# Patient Record
Sex: Female | Born: 1995 | Race: Black or African American | Hispanic: No | Marital: Married | Smoking: Never smoker
Health system: Southern US, Community
[De-identification: ages and names within clinical notes are randomized; demographics above are authoritative.]

## PROBLEM LIST (undated history)

## (undated) DIAGNOSIS — D582 Other hemoglobinopathies: Secondary | ICD-10-CM

## (undated) DIAGNOSIS — O139 Gestational [pregnancy-induced] hypertension without significant proteinuria, unspecified trimester: Secondary | ICD-10-CM

## (undated) DIAGNOSIS — A749 Chlamydial infection, unspecified: Secondary | ICD-10-CM

## (undated) DIAGNOSIS — D649 Anemia, unspecified: Secondary | ICD-10-CM

## (undated) HISTORY — PX: FRACTURE SURGERY: SHX138

## (undated) HISTORY — PX: HERNIA REPAIR: SHX51

---
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
## 2015-01-26 LAB — OB RESULTS CONSOLE RPR: RPR: NONREACTIVE

## 2015-01-26 LAB — OB RESULTS CONSOLE RUBELLA ANTIBODY, IGM: Rubella: IMMUNE

## 2015-01-26 LAB — OB RESULTS CONSOLE HIV ANTIBODY (ROUTINE TESTING): HIV: NONREACTIVE

## 2015-01-26 LAB — OB RESULTS CONSOLE HEPATITIS B SURFACE ANTIGEN: Hepatitis B Surface Ag: NEGATIVE

## 2015-05-16 NOTE — L&D Delivery Note (Addendum)
Delivery Note 2303: Nurse call reports patient with significant discomfort and urge to push.  In room to assess.  Patient C/C/0, +1.  Patient with good efforts during trial push and room prepped for delivery.  FHR remained reassuring and patient delivered as below with family and staff support.  At 11:32 PM, on August 11, 2015, a viable female "Ann Farmer" was delivered via Vaginal, Spontaneous Delivery (Presentation: Right Occiput Anterior).   After delivery of head, nuchal cord noted that shoulders and body was delivered through via somersault maneuver. Infant with good tone and spontaneous cry. Tactile stimulation and bulb suction given by provider and infant placed on mother's abdomen where nurse continued tactile stimulation.  Infant APGAR: 8,9. Cord clamped, cut, and blood collected. Placenta delivered spontaneously and noted to be intact with 3VC upon inspection.  Placenta to pathology for PIH.  Vaginal inspection revealed a 1st degree perineal laceration that was repaired with 3-0 vicryl on CT-1.  No additional anesthetic necessary and patient tolerated the procedure well. Fundus firm, at the umbilicus, and bleeding small.  Mother hemodynamically stable and infant skin to skin prior to provider exit.  Mother desires birth control, but is unsure of method and opts to breastfeed.  Infant weight at one hour of life: 7lbs 8.5oz, 20.75 in  Anesthesia: Epidural  Episiotomy: None Lacerations: 1st degree Suture Repair: 3.0 vicryl Est. Blood Loss (mL):  200  Mom to postpartum.  Baby to Couplet care / Skin to Skin.  Cherre RobinsJessica L Ksenia Kunz MSN, CNM 08/12/2015, 12:33 AM

## 2015-06-03 LAB — OB RESULTS CONSOLE ABO/RH: RH Type: POSITIVE

## 2015-07-22 LAB — OB RESULTS CONSOLE GC/CHLAMYDIA
Chlamydia: POSITIVE
GC PROBE AMP, GENITAL: NEGATIVE

## 2015-07-22 LAB — OB RESULTS CONSOLE GBS: STREP GROUP B AG: NEGATIVE

## 2015-08-10 ENCOUNTER — Inpatient Hospital Stay (HOSPITAL_COMMUNITY)
Admission: AD | Admit: 2015-08-10 | Discharge: 2015-08-13 | DRG: 774 | Disposition: A | Discharge status: Home / Self Care | Payer: Medicaid Other | Source: Ambulatory Visit | Attending: Obstetrics and Gynecology | Admitting: Obstetrics and Gynecology

## 2015-08-10 ENCOUNTER — Encounter (HOSPITAL_COMMUNITY): Payer: Self-pay | Admitting: *Deleted

## 2015-08-10 DIAGNOSIS — O98819 Other maternal infectious and parasitic diseases complicating pregnancy, unspecified trimester: Secondary | ICD-10-CM | POA: Diagnosis not present

## 2015-08-10 DIAGNOSIS — G5600 Carpal tunnel syndrome, unspecified upper limb: Secondary | ICD-10-CM | POA: Diagnosis present

## 2015-08-10 DIAGNOSIS — A749 Chlamydial infection, unspecified: Secondary | ICD-10-CM | POA: Diagnosis not present

## 2015-08-10 DIAGNOSIS — A568 Sexually transmitted chlamydial infection of other sites: Secondary | ICD-10-CM | POA: Diagnosis present

## 2015-08-10 DIAGNOSIS — Z3A39 39 weeks gestation of pregnancy: Secondary | ICD-10-CM

## 2015-08-10 DIAGNOSIS — J45909 Unspecified asthma, uncomplicated: Secondary | ICD-10-CM | POA: Diagnosis present

## 2015-08-10 DIAGNOSIS — O9832 Other infections with a predominantly sexual mode of transmission complicating childbirth: Secondary | ICD-10-CM | POA: Diagnosis present

## 2015-08-10 DIAGNOSIS — O134 Gestational [pregnancy-induced] hypertension without significant proteinuria, complicating childbirth: Secondary | ICD-10-CM | POA: Diagnosis present

## 2015-08-10 DIAGNOSIS — O133 Gestational [pregnancy-induced] hypertension without significant proteinuria, third trimester: Secondary | ICD-10-CM | POA: Diagnosis present

## 2015-08-10 DIAGNOSIS — O9952 Diseases of the respiratory system complicating childbirth: Secondary | ICD-10-CM | POA: Diagnosis present

## 2015-08-10 DIAGNOSIS — R030 Elevated blood-pressure reading, without diagnosis of hypertension: Secondary | ICD-10-CM | POA: Diagnosis present

## 2015-08-10 HISTORY — DX: Anemia, unspecified: D64.9

## 2015-08-10 HISTORY — DX: Chlamydial infection, unspecified: A74.9

## 2015-08-10 LAB — COMPREHENSIVE METABOLIC PANEL
ALK PHOS: 170 U/L — AB (ref 38–126)
ALT: 12 U/L — ABNORMAL LOW (ref 14–54)
AST: 21 U/L (ref 15–41)
Albumin: 3.2 g/dL — ABNORMAL LOW (ref 3.5–5.0)
Anion gap: 7 (ref 5–15)
BILIRUBIN TOTAL: 0.5 mg/dL (ref 0.3–1.2)
BUN: 13 mg/dL (ref 6–20)
CALCIUM: 8.6 mg/dL — AB (ref 8.9–10.3)
CO2: 20 mmol/L — ABNORMAL LOW (ref 22–32)
CREATININE: 0.75 mg/dL (ref 0.44–1.00)
Chloride: 111 mmol/L (ref 101–111)
GFR calc Af Amer: >60 mL/min (ref >60)
Glucose, Bld: 89 mg/dL (ref 65–99)
POTASSIUM: 3.7 mmol/L (ref 3.5–5.1)
Sodium: 138 mmol/L (ref 135–145)
TOTAL PROTEIN: 6.2 g/dL — AB (ref 6.5–8.1)

## 2015-08-10 LAB — RAPID HIV SCREEN (HIV 1/2 AB+AG)
HIV 1/2 ANTIBODIES: NONREACTIVE
HIV-1 P24 ANTIGEN - HIV24: NONREACTIVE

## 2015-08-10 LAB — PROTEIN / CREATININE RATIO, URINE
Creatinine, Urine: 112 mg/dL
Protein Creatinine Ratio: 0.23 mg/mg{Cre} — ABNORMAL HIGH (ref 0.00–0.15)
TOTAL PROTEIN, URINE: 26 mg/dL

## 2015-08-10 LAB — CBC
HEMATOCRIT: 27.9 % — AB (ref 36.0–46.0)
Hemoglobin: 10.1 g/dL — ABNORMAL LOW (ref 12.0–15.0)
MCH: 28.5 pg (ref 26.0–34.0)
MCHC: 36.2 g/dL — AB (ref 30.0–36.0)
MCV: 78.6 fL (ref 78.0–100.0)
Platelets: 209 10*3/uL (ref 150–400)
RBC: 3.55 MIL/uL — ABNORMAL LOW (ref 3.87–5.11)
RDW: 14.7 % (ref 11.5–15.5)
WBC: 11 10*3/uL — ABNORMAL HIGH (ref 4.0–10.5)

## 2015-08-10 LAB — TYPE AND SCREEN
ABO/RH(D): O POS
ANTIBODY SCREEN: NEGATIVE

## 2015-08-10 LAB — URIC ACID: URIC ACID, SERUM: 7.5 mg/dL — AB (ref 2.3–6.6)

## 2015-08-10 LAB — ABO/RH: ABO/RH(D): O POS

## 2015-08-10 LAB — LACTATE DEHYDROGENASE: LDH: 145 U/L (ref 98–192)

## 2015-08-10 MED ORDER — ONDANSETRON HCL 4 MG/2ML IJ SOLN: 4.0000 mg | Freq: Four times a day (QID) | INTRAMUSCULAR | Status: DC | PRN

## 2015-08-10 MED ORDER — OXYTOCIN BOLUS FROM INFUSION
500.0000 mL | INTRAVENOUS | Status: DC
  Administered 2015-08-11: 500 mL via INTRAVENOUS

## 2015-08-10 MED ORDER — LIDOCAINE HCL (PF) 1 % IJ SOLN
30.0000 mL | INTRAMUSCULAR | Status: DC | PRN
  Filled 2015-08-10: qty 30

## 2015-08-10 MED ORDER — HYDRALAZINE HCL 20 MG/ML IJ SOLN
10.0000 mg | Freq: Once | INTRAMUSCULAR | Status: DC | PRN
Start: 2015-08-10 — End: 2015-08-12
  Filled 2015-08-10: qty 1

## 2015-08-10 MED ORDER — CITRIC ACID-SODIUM CITRATE 334-500 MG/5ML PO SOLN: 30.0000 mL | ORAL | Status: DC | PRN

## 2015-08-10 MED ORDER — LACTATED RINGERS IV SOLN
INTRAVENOUS | Status: DC
  Administered 2015-08-10 – 2015-08-11 (×2): via INTRAVENOUS
  Administered 2015-08-11: 125 mL/h via INTRAVENOUS

## 2015-08-10 MED ORDER — TERBUTALINE SULFATE 1 MG/ML IJ SOLN
0.2500 mg | Freq: Once | INTRAMUSCULAR | Status: DC | PRN
  Filled 2015-08-10: qty 1

## 2015-08-10 MED ORDER — ACETAMINOPHEN 325 MG PO TABS: 650.0000 mg | ORAL_TABLET | ORAL | Status: DC | PRN

## 2015-08-10 MED ORDER — LABETALOL HCL 5 MG/ML IV SOLN
20.0000 mg | INTRAVENOUS | Status: AC | PRN
  Administered 2015-08-10 – 2015-08-11 (×3): 20 mg via INTRAVENOUS
  Filled 2015-08-10 (×3): qty 4

## 2015-08-10 MED ORDER — LACTATED RINGERS IV SOLN: 2.5000 [IU]/h | INTRAVENOUS | Status: DC

## 2015-08-10 MED ORDER — LACTATED RINGERS IV SOLN: 500.0000 mL | INTRAVENOUS | Status: DC | PRN

## 2015-08-10 MED ORDER — FENTANYL CITRATE (PF) 100 MCG/2ML IJ SOLN
50.0000 ug | INTRAMUSCULAR | Status: DC | PRN
  Administered 2015-08-11: 100 ug via INTRAVENOUS
  Administered 2015-08-11: 50 ug via INTRAVENOUS
  Administered 2015-08-11: 100 ug via INTRAVENOUS
  Filled 2015-08-10 (×4): qty 2

## 2015-08-10 MED ORDER — MISOPROSTOL 25 MCG QUARTER TABLET
25.0000 ug | ORAL_TABLET | ORAL | Status: DC | PRN
  Administered 2015-08-10: 25 ug via VAGINAL
  Filled 2015-08-10: qty 0.25
  Filled 2015-08-10: qty 1

## 2015-08-10 NOTE — H&P (Signed)
Ann Farmer is a 20 y.o. female, G1P0 at 39.1 weeks, presenting for evaluation after elevated BP in the office.  Patient sent to MAU for BP check and no significant change.  PIH labs negative in office and patient denies current symptoms.  Dr. ND consulted and patient admitted for IOL secondary to [facility] without significant proteinuria.  Patient desires WB and informed this no longer an option.  Patient pregnancy history significant for CT infection x 2 and current TOC pending.  Patient is GBS negative and has personal history of asthma.    Patient Active Problem List   Diagnosis Date Noted  . Gestational hypertension w/o significant proteinuria in 3rd trimester 08/10/2015  . Asthma 08/10/2015  . Chlamydia infection, current pregnancy 08/10/2015    History of present pregnancy: Patient entered care at 11 weeks at [facility] and Transferred to [facility] at 27.3wks with WB desire.   EDC of 08/16/2015 was established by Definite LMP of 11/09/2014.   Anatomy scan:  20.4 weeks, with normal findings and an anterior placenta.   Additional [place] evaluations:  -[place] 04/01/15--20 4/7 weeks, Cervix 5 cm, anterior fundal placenta, female, poor view of heart and LE -F/U Anatomy 27.3wks: Singleton pregnancy. Vertex presentation. Anterior placenta. AFI is normal--25th%. Cervix closed. Right ovary not visualized. Left ovary and adnexas appear WNL.  Significant prenatal events: 1st Trimester: Positive CT with Negative TOC 2nd Trimester: Tx into practice. 3rd Trimester:   Reports Carpal tunnel syndrome-mgmt discussed.  Reports swelling of feet/ankles. Positive CT-TOC remained positive. C/O BH ctx.  Last evaluation:  08/10/2015 in office by S. Devane-Johnson, CNM.  FHR 140, VE 1/60/-2, BP 142/84.  Wt 195lbs  OB History    Gravida Para Term Preterm AB TAB SAB Ectopic Multiple Living   1              Past Medical History  Diagnosis Date  . Anemia    History reviewed. No pertinent past surgical  history. Family History: family history is not on file. Social History:  reports that she has never smoked. She has never used smokeless tobacco. She reports that she does not drink alcohol or use illicit drugs.  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  Prenatal Transfer Tool  Maternal Diabetes: No Genetic Screening: Normal-Quad and CF Maternal Ultrasounds/Referrals: Normal Fetal Ultrasounds or other Referrals:  None Maternal Substance Abuse:  No Significant Maternal Medications:  None Significant Maternal Lab Results: Lab values include: Group B Strep negative    ROS:  +FM, -Lof, -Vb, -Ctx Patient denies headache, visual disturbances, epigastric pain, numbness/tingling, and SOB Patient denies recent illness.  No Known Allergies   Dilation: 1 Effacement (%): 50 Station: Ballotable Exam by:: Jaydence Arnesen cnm  Blood pressure 163/103, pulse 90, temperature 98.2 F (36.8 C), temperature source Oral, resp. rate 18, height  (1.626 m), weight 88.508 kg (195 lb 2 oz).  Physical Exam  Constitutional: She is oriented to person, place, and time. She appears well-developed and well-nourished. No distress.  HENT:  Head: Normocephalic and atraumatic.  Eyes: Conjunctivae are normal.  Neck: Normal range of motion.  Cardiovascular: Normal rate, regular rhythm and normal heart sounds.   Respiratory: Effort normal and breath sounds normal. No respiratory distress.  GI: Soft. Bowel sounds are normal.  Gravid--fundal height appears AGA, Soft, NT   Genitourinary: Vagina normal.  Musculoskeletal: Normal range of motion. She exhibits edema (+3 Pitting in BLE).  Neurological: She is alert  and oriented to person, place, and time.  Skin: Skin is warm and dry.    Leopolds: EFW: 7lbs Presentation: Vertex  FHR: 165 bpm, Mod Var, -Decels, +Accels UCs:  Occassional  Prenatal labs: ABO, Rh:  O Positive Antibody:  Negative Rubella:   Immune RPR:  NR  HBsAg:   Negative HIV:   NR GBS:  Negative Sickle cell/Hgb electrophoresis:  Negative Pap:  N/A GC:  Negative Chlamydia:  Positive (9/16)/Negative (12/16)/Positive (07/22/2015)/TOC Pending (08/10/2015) Other:  08/06/2015: UA 3.2, LDH: 161   08/10/2015: UA: 7.1, LDH 160, PCR: 0.17    Assessment IUP at 39.1wks Cat I FT GHTN Asthma GBS Negative Positive CT on 3/9  Plan: Admit to [facility]thing Suites per consult with Dr. ND Routine Labor and Delivery Orders per [facility] Protocol Routine Induction/Augmentation Orders PreEclampsia Focused Orderset-Labetalol IV In room to complete assessment and discuss POC: -Discussed r/b of induction including fetal distress, serial induction, pain, and increased risk of c/s delivery -Discussed induction methods including cervical ripening agents, foley bulbs, and pitocin -Patient verbalizes understanding and wishes to proceed with induction process Q/C addressed Plan for cytotec Give IV Labetalol now  Joellyn QuailsJessica L EmlyCNM, MSN 08/10/2015, 9:10 PM     Addendum 2345  In room to start induction process Cytotec inserted PV without difficulty Cat I FT Patient requests meal-informed that clear liquid diet available, but no other meal due to HTN and start of indx process. Encouraged rest Dr. ND updated on patient status  Cherre RobinsJessica L Ingri Diemer

## 2015-08-10 NOTE — MAU Note (Signed)
PT  SAYS  SHE WAS AT OFFICE   TODAY  AND  DID LABS-  WITH  NO RESULTS  YET   SO TOLD  TO COME  HERE    -  BP  HAS  BEEN  ELEVATED.     NO H/A,   NO BLURRED  VISION .   FEET  SWOLLEN.   BP WAS 142/80.     VE   FT.

## 2015-08-11 ENCOUNTER — Inpatient Hospital Stay (HOSPITAL_COMMUNITY): Payer: Medicaid Other | Admitting: Anesthesiology

## 2015-08-11 LAB — CBC
HCT: 27.8 % — ABNORMAL LOW (ref 36.0–46.0)
HEMOGLOBIN: 10.2 g/dL — AB (ref 12.0–15.0)
MCH: 28.9 pg (ref 26.0–34.0)
MCHC: 36.7 g/dL — ABNORMAL HIGH (ref 30.0–36.0)
MCV: 78.8 fL (ref 78.0–100.0)
PLATELETS: 202 10*3/uL (ref 150–400)
RBC: 3.53 MIL/uL — AB (ref 3.87–5.11)
RDW: 14.9 % (ref 11.5–15.5)
WBC: 14.2 10*3/uL — AB (ref 4.0–10.5)

## 2015-08-11 LAB — RPR: RPR: NONREACTIVE

## 2015-08-11 MED ORDER — LIDOCAINE HCL (PF) 1 % IJ SOLN
INTRAMUSCULAR | Status: DC | PRN
Start: 2015-08-11 — End: 2015-08-11
  Administered 2015-08-11: 3 mL
  Administered 2015-08-11: 2 mL via EPIDURAL
  Administered 2015-08-11: 5 mL

## 2015-08-11 MED ORDER — EPHEDRINE 5 MG/ML INJ
10.0000 mg | INTRAVENOUS | Status: DC | PRN
  Filled 2015-08-11: qty 2

## 2015-08-11 MED ORDER — PHENYLEPHRINE 40 MCG/ML (10ML) SYRINGE FOR IV PUSH (FOR BLOOD PRESSURE SUPPORT)
80.0000 ug | PREFILLED_SYRINGE | INTRAVENOUS | Status: DC | PRN
Start: 2015-08-11 — End: 2015-08-12
  Filled 2015-08-11: qty 2

## 2015-08-11 MED ORDER — LACTATED RINGERS IV SOLN
500.0000 mL | Freq: Once | INTRAVENOUS | Status: AC
  Administered 2015-08-11: 500 mL via INTRAVENOUS

## 2015-08-11 MED ORDER — PHENYLEPHRINE 40 MCG/ML (10ML) SYRINGE FOR IV PUSH (FOR BLOOD PRESSURE SUPPORT)
80.0000 ug | PREFILLED_SYRINGE | INTRAVENOUS | Status: DC | PRN
  Filled 2015-08-11: qty 2

## 2015-08-11 MED ORDER — OXYTOCIN 10 UNIT/ML IJ SOLN
1.0000 m[IU]/min | INTRAMUSCULAR | Status: DC
  Administered 2015-08-11: 2 m[IU]/min via INTRAVENOUS
  Filled 2015-08-11: qty 10

## 2015-08-11 MED ORDER — HYDRALAZINE HCL 20 MG/ML IJ SOLN
10.0000 mg | Freq: Once | INTRAMUSCULAR | Status: AC | PRN
  Administered 2015-08-12: 10 mg via INTRAVENOUS

## 2015-08-11 MED ORDER — FENTANYL CITRATE (PF) 100 MCG/2ML IJ SOLN
100.0000 ug | INTRAMUSCULAR | Status: DC | PRN
  Administered 2015-08-11: 100 ug via INTRAVENOUS

## 2015-08-11 MED ORDER — DIPHENHYDRAMINE HCL 50 MG/ML IJ SOLN: 12.5000 mg | INTRAMUSCULAR | Status: DC | PRN

## 2015-08-11 MED ORDER — PHENYLEPHRINE 40 MCG/ML (10ML) SYRINGE FOR IV PUSH (FOR BLOOD PRESSURE SUPPORT)
80.0000 ug | PREFILLED_SYRINGE | INTRAVENOUS | Status: DC | PRN
  Filled 2015-08-11: qty 20
  Filled 2015-08-11: qty 2

## 2015-08-11 MED ORDER — LACTATED RINGERS IV SOLN: 500.0000 mL | Freq: Once | INTRAVENOUS | Status: DC

## 2015-08-11 MED ORDER — LABETALOL HCL 5 MG/ML IV SOLN
20.0000 mg | INTRAVENOUS | Status: DC | PRN
  Administered 2015-08-11: 40 mg via INTRAVENOUS
  Administered 2015-08-11: 20 mg via INTRAVENOUS
  Filled 2015-08-11: qty 4
  Filled 2015-08-11: qty 8
  Filled 2015-08-11 (×4): qty 4

## 2015-08-11 MED ORDER — TERBUTALINE SULFATE 1 MG/ML IJ SOLN
0.2500 mg | Freq: Once | INTRAMUSCULAR | Status: DC | PRN
  Filled 2015-08-11: qty 1

## 2015-08-11 MED ORDER — FENTANYL 2.5 MCG/ML BUPIVACAINE 1/10 % EPIDURAL INFUSION (WH - ANES)
14.0000 mL/h | INTRAMUSCULAR | Status: DC | PRN
  Administered 2015-08-11 (×2): 14 mL/h via EPIDURAL
  Filled 2015-08-11 (×2): qty 125

## 2015-08-11 NOTE — Progress Notes (Signed)
Brantley FlingRoyale Lundy MRN: [id number]  Subjective: -Care assumed.  Patient resting in bed.  Reports comfort with epidural.  Nurse reports patient requests change in foley catheter due to discomfort.    Objective: BP 152/95 mmHg  Pulse 90  Temp(Src) 98.6 F (37 C) (Oral)  Resp 16  Ht 5\' 4"  (1.626 m)  Wt 88.451 kg (195 lb)  BMI 33.46 kg/m2  SpO2 98%     Filed Vitals:   08/11/15 1932 08/11/15 1940 08/11/15 2002 08/11/15 2031  BP: 173/94 152/95 151/92 170/103  Pulse: 101 90 93 104  Temp: 98.6 F (37 C)     TempSrc: Oral     Resp: 16 16 16 16   Height:      Weight:      SpO2:        Fetal Monitoring: FHT: 135 bpm, Mod Var, -Decels, +Accels UC: Q2-583min, palpates moderate to strong, MVUs 195mmHg    Vaginal Exam: SVE:   Dilation: 4.5 Effacement (%): 90 Station: -1 Exam by:: j. Isham Smitherman cnm Membranes: AROM at 1450   Internal Monitors: IUPC in place  Augmentation/Induction: Pitocin:78mUn/min Cytotec: S/P One dose Foley Bulb: S/P  Assessment:  IUP at 39.2wks Cat I FT  Pitocin IOL GHTN  Plan: -Patient received one dose labetalol at 1900 -Discussed VE findings.  Informed that with no progress in spite of adequate contractions would recommend c/s after so many hours.  However, currently will continue present mgmt. -No q/c regarding findings -Okay to change foley catheter to non latex -Continue other mgmt as ordered  Valma CavaJessica L Jearline Hirschhorn,MSN, CNM 08/11/2015, 7:54 PM

## 2015-08-11 NOTE — Anesthesia Preprocedure Evaluation (Signed)
Anesthesia Evaluation  Patient identified by MRN, date of birth, ID band Patient awake    Reviewed: Allergy & Precautions, Patient's Chart, lab work & pertinent test results  Airway Mallampati: II       Dental   Pulmonary neg pulmonary ROS,    Pulmonary exam normal        Cardiovascular hypertension, Normal cardiovascular exam  IOL for GHTN.   Neuro/Psych negative neurological ROS     GI/Hepatic negative GI ROS, Neg liver ROS,   Endo/Other  negative endocrine ROS  Renal/GU negative Renal ROS     Musculoskeletal   Abdominal   Peds  Hematology  (+) anemia ,   Anesthesia Other Findings   Reproductive/Obstetrics (+) Pregnancy                             Lab Results  Component Value Date   WBC 14.2* 08/11/2015   HGB 10.2* 08/11/2015   HCT 27.8* 08/11/2015   MCV 78.8 08/11/2015   PLT 202 08/11/2015   Lab Results  Component Value Date   CREATININE 0.75 08/10/2015   BUN 13 08/10/2015   NA 138 08/10/2015   K 3.7 08/10/2015   CL 111 08/10/2015   CO2 20* 08/10/2015    Anesthesia Physical Anesthesia Plan  ASA: II  Anesthesia Plan: Epidural   Post-op Pain Management:    Induction:   Airway Management Planned:   Additional Equipment:   Intra-op Plan:   Post-operative Plan:   Informed Consent: I have reviewed the patients History and Physical, chart, labs and discussed the procedure including the risks, benefits and alternatives for the proposed anesthesia with the patient or authorized representative who has indicated his/her understanding and acceptance.     Plan Discussed with:   Anesthesia Plan Comments:         Anesthesia Quick Evaluation

## 2015-08-11 NOTE — Anesthesia Procedure Notes (Signed)
Epidural Patient location during procedure: OB  Preanesthetic Checklist Completed: patient identified, site marked, surgical consent, pre-op evaluation, timeout performed, IV checked, risks and benefits discussed and monitors and equipment checked  Epidural Patient position: sitting Prep: site prepped and draped and DuraPrep Patient monitoring: continuous pulse ox and blood pressure Approach: midline Location: L4-L5 Injection technique: LOR saline  Needle:  Needle type: Tuohy  Needle gauge: 17 G Needle length: 9 cm and 9 Needle insertion depth: 8 cm Catheter type: closed end flexible Catheter size: 19 Gauge Catheter at skin depth: 13 cm Test dose: negative  Assessment Events: blood not aspirated, injection not painful, no injection resistance, negative IV test and no paresthesia

## 2015-08-11 NOTE — Progress Notes (Addendum)
Labor Progress  Subjective: Starting to breath thru each ctx.  We had a discussion regarding IUPC and AROM.  Pt wants to think about it.  Objective: BP 141/86 mmHg  Pulse 88  Temp(Src) 98.1 F (36.7 C) (Oral)  Resp 18  Ht 5\' 4"  (1.626 m)  Wt 195 lb (88.451 kg)  BMI 33.46 kg/m2     FHT: 135, moderate variabiliy, + accel, no current decel SP prolong decel during VE CTX:  regular, every 3-5 minutes Uterus gravid, soft non tender SVE:  4/60/-2 Pitocin at 968mUn/min  Assessment:  IUP at 39.2 weeks IOL d/t PIH NICHD: Category 2 Membranes:  intact Induction: Pitocin - 8mu  Pain management:   IV pain medicine: Yes  Nitrous: yes Epidural placement: GBS negative  Plan: Continue labor plan Continuous monitoring Frequent position changes to facilitate fetal rotation and descent. Will reassess with cervical exam at 1600 or earlier if necessary Continue pitocin per protocol      Haiden Clucas, CNM, MSN 08/11/2015. 3:43 PM

## 2015-08-11 NOTE — Progress Notes (Signed)
Pt request epidural

## 2015-08-11 NOTE — Consults (Signed)
  Anesthesia Pain Consult Note  Patient: Ann Farmer, 20 y.o., female  Consult Requested by: Jaymes GraffNaima Dillard, MD  Reason for Consult: CRNA Pain Round  Level of Consciousness: alert  Pain: 6 /10 Pain Goal: 10  Last Vitals:  Filed Vitals:   08/11/15 0935 08/11/15 1000  BP: 148/80 153/89  Pulse: 92 87  Temp:    Resp: 16 15    Plan: Epidural infusion for pain control.    Charlot Gouin 08/11/2015

## 2015-08-11 NOTE — Progress Notes (Signed)
Ann Farmer [id number]  Subjective: Strip and Chart Reviewed.  Objective:  Filed Vitals:   08/11/15 0302 08/11/15 0437 08/11/15 0637 08/11/15 0649  BP: 156/86 157/83 173/91 161/85  Pulse: 83 75 75 80  Temp:   98 F (36.7 C)   TempSrc:   Oral   Resp: 18 18 20    Height:      Weight:        FHR: 145 bpm, Mod Var, -Decels, +Accels UC: Q2-345min with coupling noted  Assessment: IUP at 4341w2d Cat 1 FT IOL: Foley Bulb GHTN  Plan: -Give IV labetalol per parameters -Continue other mgmt as ordered -Dr. ND updated -Report to be given to V.Standard, CNM  Sabas SousJ. Gadiel John, CNM 08/11/2015 6:59 AM

## 2015-08-11 NOTE — Progress Notes (Signed)
Labor Progress  Subjective: No complaints at this time, she is SP IV pain medication at 0644.  Foley bulb dislodged, reviewed POC  Objective: BP 143/94 mmHg  Pulse 88  Temp(Src) 98.1 F (36.7 C) (Oral)  Resp 16  Ht 5\' 4"  (1.626 m)  Wt 195 lb (88.451 kg)  BMI 33.46 kg/m2     FHT: 130, moderate variability + accel, no decel CTX:  regular, every 2 - 3minutes Uterus gravid, soft non tender SVE:  Dilation: 3 Effacement (%): 60 Station: -2 Exam by:: Anzal Bartnick CNM   Assessment:  IUP at 39.2 weeks IOL d/t PIH NICHD: Category 1 Membranes: intact  Induction:   Cytotec xx1  Foley Bulb: fell out at 0745  Pitocin - n/a  Pain management:  IV pain management: yes Epidural placement: n/a GBS negative  Plan: Continue labor plan Continuous monitoring Rest/Ambulate Frequent position changes to facilitate fetal rotation and descent. Will reassess with cervical exam at 1200 or earlier if necessary Start pitocin per protocol holding at 2mu until further notice      Mayli Covington, CNM, MSN 08/11/2015. 8:45 AM

## 2015-08-11 NOTE — Progress Notes (Signed)
Labor Progress  Subjective: Feeling much better with the epidural  Objective: BP 159/97 mmHg  Pulse 85  Temp(Src) 98.1 F (36.7 C) (Oral)  Resp 16  Ht 5\' 4"  (1.626 m)  Wt 195 lb (88.451 kg)  BMI 33.46 kg/m2  SpO2 98%     FHT: 130,  Moderate variability + accel early decels CTX:  regular, every 2-4 minutes Uterus gravid, soft non tender SVE:  Dilation: 4.5 Effacement (%): 90 Station: -1 Exam by:: CNM Titiana Severa Pitocin at 748mUn/min  Assessment:  IUP at 39.2 weeks IOL d/t PIH NICHD: Category 1 Membranes:  AROM x 3hrs, no s/s of infection MVU 300  Induction: Pitocin - 8mu  Pain management:  IV pain management: SP  Nitrous: SP  IV meds:  SP Epidural placement: Yes GBS negative  Plan: Continue labor plan Continuous monitoring Frequent position changes to facilitate fetal rotation and descent. Continue pitocin per protocol foley     Ciera Beckum, CNM, MSN 08/11/2015. 6:24 PM

## 2015-08-11 NOTE — Progress Notes (Signed)
Chlamydia test of cure is negative from 08/10/2015.  Dr. Stefano GaulStringer

## 2015-08-11 NOTE — Progress Notes (Signed)
Ann Farmer MRN: [id number]  Subjective: -Nurse reports patient with frequent contractions.  Strip reviewed.  In room to assess.    Objective: BP 147/85 mmHg  Pulse 90  Temp(Src) 98.7 F (37.1 C) (Oral)  Resp 18  Ht 5\' 4"  (1.626 m)  Wt 88.451 kg (195 lb)  BMI 33.46 kg/m2      Fetal Monitoring: FHT: 135 bpm, Mod Var, -Decels, +Accels UC: Q2-214min, palpates mild    Vaginal Exam: SVE:   Dilation: 1.5 Effacement (%): 50 Station: -3 Exam by:: Gerrit HeckJessica Raha Tennison CNM Membranes:Intact Internal Monitors: None  Augmentation/Induction: Pitocin:None Cytotec: S/P 1 Dose Foley Bulb inserted  Assessment:  IUP at 39.2wks Cat I FT  IOL GHTN  Plan: -Foley bulb inserted and patient tolerated well -Requests medication after placement-okay for fentanyl IV -Will start pitocin as appropriate -Continue other mgmt as ordered -Dr. ND updated on patient status  Ann CopaJessica L Nayeliz Hipp,MSN, CNM 08/11/2015, 3:30 AM

## 2015-08-11 NOTE — Progress Notes (Signed)
Labor Progress  Subjective: POC discussed  Objective: BP 159/97 mmHg  Pulse 85  Temp(Src) 98.1 F (36.7 C) (Oral)  Resp 16  Ht 5\' 4"  (1.626 m)  Wt 195 lb (88.451 kg)  BMI 33.46 kg/m2  SpO2 98%     FHT: 130, moderate variability, + accel, no decel CTX:  regular, every 2-4 minutes Uterus gravid, soft non tender SVE:  4/60/-1 Pitocin at 808mUn/min  Assessment:  IUP at 39.2 weeks IOL d/t PIH NICHD: Category 1 Membranes:  AROM at 1450  Induction:   Pitocin - 8mu  Pain management:  IV pain management: SP  Nitrous: yes Epidural placement: n/a GBS negative  IUPC placed  Plan: Continue labor plan Continuous/intermittent monitoring Rest Ambulate Frequent position changes to facilitate fetal rotation and descent. Will reassess with cervical exam at 1800 or earlier if necessary Continue pitocin per protocol    Cela Newcom, CNM, MSN 08/11/2015. 6:10 PM

## 2015-08-12 ENCOUNTER — Encounter (HOSPITAL_COMMUNITY): Payer: Self-pay

## 2015-08-12 LAB — CBC
HCT: 26.8 % — ABNORMAL LOW (ref 36.0–46.0)
HEMATOCRIT: 27.9 % — AB (ref 36.0–46.0)
Hemoglobin: 10.3 g/dL — ABNORMAL LOW (ref 12.0–15.0)
Hemoglobin: 9.8 g/dL — ABNORMAL LOW (ref 12.0–15.0)
MCH: 28.7 pg (ref 26.0–34.0)
MCH: 29 pg (ref 26.0–34.0)
MCHC: 36.6 g/dL — ABNORMAL HIGH (ref 30.0–36.0)
MCHC: 36.9 g/dL — ABNORMAL HIGH (ref 30.0–36.0)
MCV: 78.4 fL (ref 78.0–100.0)
MCV: 78.6 fL (ref 78.0–100.0)
PLATELETS: 183 10*3/uL (ref 150–400)
PLATELETS: 198 10*3/uL (ref 150–400)
RBC: 3.42 MIL/uL — AB (ref 3.87–5.11)
RBC: 3.55 MIL/uL — ABNORMAL LOW (ref 3.87–5.11)
RDW: 14.7 % (ref 11.5–15.5)
RDW: 14.8 % (ref 11.5–15.5)
WBC: 14.4 10*3/uL — AB (ref 4.0–10.5)
WBC: 16 10*3/uL — AB (ref 4.0–10.5)

## 2015-08-12 MED ORDER — SIMETHICONE 80 MG PO CHEW
80.0000 mg | CHEWABLE_TABLET | ORAL | Status: DC | PRN
  Administered 2015-08-13: 80 mg via ORAL
  Filled 2015-08-12: qty 1

## 2015-08-12 MED ORDER — SENNOSIDES-DOCUSATE SODIUM 8.6-50 MG PO TABS
2.0000 | ORAL_TABLET | ORAL | Status: DC
  Administered 2015-08-13: 2 via ORAL
  Filled 2015-08-12: qty 2

## 2015-08-12 MED ORDER — IBUPROFEN 600 MG PO TABS
600.0000 mg | ORAL_TABLET | Freq: Four times a day (QID) | ORAL | Status: DC
  Administered 2015-08-12 – 2015-08-13 (×5): 600 mg via ORAL
  Filled 2015-08-12 (×5): qty 1

## 2015-08-12 MED ORDER — LANOLIN HYDROUS EX OINT: TOPICAL_OINTMENT | CUTANEOUS | Status: DC | PRN

## 2015-08-12 MED ORDER — BENZOCAINE-MENTHOL 20-0.5 % EX AERO
1.0000 "application " | INHALATION_SPRAY | CUTANEOUS | Status: DC | PRN
  Administered 2015-08-12: 1 via TOPICAL
  Filled 2015-08-12: qty 56

## 2015-08-12 MED ORDER — HYDRALAZINE HCL 20 MG/ML IJ SOLN: 10.0000 mg | Freq: Once | INTRAMUSCULAR | Status: DC | PRN

## 2015-08-12 MED ORDER — DIBUCAINE 1 % RE OINT: 1.0000 "application " | TOPICAL_OINTMENT | RECTAL | Status: DC | PRN

## 2015-08-12 MED ORDER — ACETAMINOPHEN 325 MG PO TABS
650.0000 mg | ORAL_TABLET | ORAL | Status: DC | PRN
  Administered 2015-08-12 – 2015-08-13 (×2): 650 mg via ORAL
  Filled 2015-08-12 (×2): qty 2

## 2015-08-12 MED ORDER — ONDANSETRON HCL 4 MG/2ML IJ SOLN: 4.0000 mg | INTRAMUSCULAR | Status: DC | PRN

## 2015-08-12 MED ORDER — LABETALOL HCL 5 MG/ML IV SOLN
20.0000 mg | INTRAVENOUS | Status: DC | PRN
  Administered 2015-08-12: 20 mg via INTRAVENOUS
  Filled 2015-08-12: qty 4

## 2015-08-12 MED ORDER — WITCH HAZEL-GLYCERIN EX PADS: 1.0000 "application " | MEDICATED_PAD | CUTANEOUS | Status: DC | PRN

## 2015-08-12 MED ORDER — FERROUS SULFATE 325 (65 FE) MG PO TABS
325.0000 mg | ORAL_TABLET | Freq: Two times a day (BID) | ORAL | Status: DC
  Administered 2015-08-12 – 2015-08-13 (×2): 325 mg via ORAL
  Filled 2015-08-12 (×2): qty 1

## 2015-08-12 MED ORDER — TETANUS-DIPHTH-ACELL PERTUSSIS 5-2.5-18.5 LF-MCG/0.5 IM SUSP: 0.5000 mL | Freq: Once | INTRAMUSCULAR | Status: DC

## 2015-08-12 MED ORDER — DIPHENHYDRAMINE HCL 25 MG PO CAPS: 25.0000 mg | ORAL_CAPSULE | Freq: Four times a day (QID) | ORAL | Status: DC | PRN

## 2015-08-12 MED ORDER — ONDANSETRON HCL 4 MG PO TABS: 4.0000 mg | ORAL_TABLET | ORAL | Status: DC | PRN

## 2015-08-12 MED ORDER — PRENATAL MULTIVITAMIN CH
1.0000 | ORAL_TABLET | Freq: Every day | ORAL | Status: DC
  Administered 2015-08-12: 1 via ORAL
  Filled 2015-08-12: qty 1

## 2015-08-12 MED ORDER — ZOLPIDEM TARTRATE 5 MG PO TABS: 5.0000 mg | ORAL_TABLET | Freq: Every evening | ORAL | Status: DC | PRN

## 2015-08-12 NOTE — Lactation Note (Signed)
This note was copied from a baby's chart. Lactation Consultation Note: Lactation Brochure given with basic teaching done. Mother is aware of hand expression and sees good flow of colostrum. Mother has 4 recorded breastfeeding's with Latch scores of 7-9. Infant is 7811 hours old. Advised mother to feed infant 8-12 times in 24 hours with a feeding cues. Cue card reviewed . Mother advised to continue to do frequent skin to skin. Mother advised to call for the next feeding assessment.   Patient Name: Ann Farmer ZOXWR'UToday's Date: 08/12/2015 Reason for consult: Initial assessment   Maternal Data    Feeding Feeding Type: Breast Fed Length of feed: 10 min  LATCH Score/Interventions Latch: Grasps breast easily, tongue down, lips flanged, rhythmical sucking. Intervention(s): Adjust position;Assist with latch;Breast compression  Audible Swallowing: Spontaneous and intermittent Intervention(s): Skin to skin;Hand expression  Type of Nipple: Everted at rest and after stimulation  Comfort (Breast/Nipple): Soft / non-tender     Hold (Positioning): Assistance needed to correctly position infant at breast and maintain latch. Intervention(s): Breastfeeding basics reviewed;Support Pillows;Skin to skin  LATCH Score: 9  Lactation Tools Discussed/Used     Consult Status Consult Status: Follow-up Date: 08/12/15 Follow-up type: In-patient    Stevan BornKendrick, Denasia Venn [facility] 08/12/2015, 11:12 AM

## 2015-08-12 NOTE — Progress Notes (Signed)
UR chart review completed.  

## 2015-08-12 NOTE — Progress Notes (Signed)
Ann Farmer   Subjective: Post Partum Day 1 Vaginal delivery, 1st degree laceration Patient up ad lib, denies syncope or dizziness. Reports consuming regular diet without issues and denies N/V No issues with urination and reports bleeding is appropriate  Feeding:  breast Contraceptive plan:   Depo prior to DC  Objective: Temp:  [97.8 F (36.6 C)-99.5 F (37.5 C)] 98.3 F (36.8 C) (03/30 0807) Pulse Rate:  [81-116] 107 (03/30 0807) Resp:  [16-24] 18 (03/30 0807) BP: (135-182)/(65-106) 135/83 mmHg (03/30 0807) SpO2:  [95 %-99 %] 95 % (03/30 0807)  Physical Exam:  General: alert and cooperative Ext: WNL, no significant  edema. No evidence of DVT seen on physical exam. Breast: Soft filling Lungs: CTAB Heart RRR without murmur  Abdomen:  Soft, fundus firm, lochia scant, + bowel sounds, non distended, non tender Lochia: appropriate Uterine Fundus: firm Laceration: healing well    Recent Labs  08/12/15 0028 08/12/15 0903  HGB 10.3* 9.8*  HCT 27.9* 26.8*    Assessment S/P Vaginal Delivery-Day 1 Stable  Normal Involution Breastfeeding   Plan: Continue current care Plan for discharge tomorrow, Breastfeeding and Lactation consult Lactation support Iron supplement  Jasmane Brockway, CNM, MSN 08/12/2015, 12:49 PM

## 2015-08-12 NOTE — Anesthesia Postprocedure Evaluation (Signed)
Anesthesia Post Note  Patient: Ann Farmer  Procedure(s) Performed: * No procedures listed *  Patient location during evaluation: Mother Baby Anesthesia Type: Epidural Level of consciousness: awake Pain management: pain level controlled Vital Signs Assessment: post-procedure vital signs reviewed and stable Respiratory status: spontaneous breathing Cardiovascular status: stable Postop Assessment: no headache, no backache, epidural receding and patient able to bend at knees Anesthetic complications: no    Last Vitals:  Filed Vitals:   08/12/15 0430 08/12/15 0445  BP: 140/70 153/75  Pulse: 106 107  Temp:  37 C  Resp:  18    Last Pain:  Filed Vitals:   08/12/15 0631  PainSc: 0-No pain                 Edison PaceWILKERSON,Khali Perella

## 2015-08-13 ENCOUNTER — Encounter (HOSPITAL_COMMUNITY): Payer: Self-pay

## 2015-08-13 MED ORDER — IBUPROFEN 600 MG PO TABS: 600.0000 mg | ORAL_TABLET | Freq: Four times a day (QID) | ORAL | Status: DC | PRN

## 2015-08-13 MED ORDER — LABETALOL HCL 100 MG PO TABS: 100.0000 mg | ORAL_TABLET | Freq: Two times a day (BID) | ORAL | Status: DC

## 2015-08-13 MED ORDER — LABETALOL HCL 100 MG PO TABS
100.0000 mg | ORAL_TABLET | Freq: Two times a day (BID) | ORAL | Status: DC
  Administered 2015-08-13: 100 mg via ORAL
  Filled 2015-08-13: qty 1

## 2015-08-13 MED ORDER — MEDROXYPROGESTERONE ACETATE 150 MG/ML IM SUSP: 150.0000 mg | Freq: Once | INTRAMUSCULAR | Status: DC

## 2015-08-13 NOTE — Discharge Summary (Signed)
[facility] Discharge Summary   Patient Name:   Ann Farmer DOB:     1995-09-03 MRN:     [id number]  Date of Admission:   08/10/2015 Date of Discharge:  08/13/2015  Admitting diagnosis:    39wks told by doctor to come Principal Problem:   SVD (spontaneous vaginal delivery) Active Problems:   Gestational hypertension w/o significant proteinuria in 3rd trimester   Asthma   Chlamydia infection, current pregnancy   First degree perineal laceration during delivery  Term Pregnancy Delivered    Discharge diagnosis:    39wks told by doctor to come Principal Problem:   SVD (spontaneous vaginal delivery) Active Problems:   Gestational hypertension w/o significant proteinuria in 3rd trimester   Asthma   Chlamydia infection, current pregnancy   First degree perineal laceration during delivery  Term Pregnancy Delivered      Negative TOC Chlamydia from 08/10/15                                                                Post partum procedures: None  Type of Delivery:  SVB  Delivering Provider: Gerrit Heck   Date of Delivery: 08/11/15   Newborn Data:    Live born female  Birth Weight: 7 lb 8.5 oz (3415 g) APGAR: 8, 9  Baby's Name:  ? Baby Feeding:   Bottle and Breast Disposition:   home with mother  Complications:   None  Hospital course:      Induction of Labor With Vaginal Delivery   20 y.o. yo G1P1001 at [redacted]w[redacted]d was admitted to the hospital 08/10/2015 for induction of labor.  Indication for induction: Gestational hypertension.  Patient had an uncomplicated labor course as follows: Membrane Rupture Time/Date: 2:50 PM ,08/11/2015   Intrapartum Procedures: Episiotomy: None [1]                                         Lacerations:  1st degree [2]  Patient had delivery of a Viable infant.  Information for the patient's newborn:  Catori, Panozzo [[id number]]  Delivery Method: Vag-Spont    08/11/2015  Details of delivery can be found in separate delivery  note.  Patient had a routine postpartum course. Patient is discharged home 08/13/2015.   Physical Exam:   Filed Vitals:   08/12/15 1556 08/12/15 1735 08/13/15 0634 08/13/15 0820  BP: 137/82 145/93 151/90 149/99  Pulse: 98 85 79   Temp: 98.1 F (36.7 C) 98.4 F (36.9 C) 98.2 F (36.8 C)   TempSrc: Oral Oral Oral   Resp: Height:      Weight:      SpO2: 99%      General: alert Lochia: appropriate Uterine Fundus: firm Incision: Healing well with no significant drainage DVT Evaluation: No evidence of DVT seen on physical exam.  Labs: CBC Latest Ref Rng 08/12/2015 08/12/2015 08/11/2015  WBC 4.0 - 10.5 K/uL 16.0(H) 14.4(H) 14.2(H)  Hemoglobin 12.0 - 15.0 g/dL 5.7(Q) 10.3(L) 10.2(L)  Hematocrit 36.0 - 46.0 % 26.8(L) 27.9(L) 27.8(L)  Platelets 150 - 400 K/uL 183 198 202   CMP Latest Ref Rng 08/10/2015  Glucose 65 - 99 mg/dL  89  BUN 6 - 20 mg/dL 13  Creatinine 1.610.44 - 0.961.00 mg/dL 0.450.75  Sodium 409135 - 811145 mmol/L 138  Potassium 3.5 - 5.1 mmol/L 3.7  Chloride 101 - 111 mmol/L 111  CO2 22 - 32 mmol/L 20(L)  Calcium 8.9 - 10.3 mg/dL 9.1(Y8.6(L)  Total Protein 6.5 - 8.1 g/dL 6.2(L)  Total Bilirubin 0.3 - 1.2 mg/dL 0.5  Alkaline Phos 38 - 126 U/L 170(H)  AST 15 - 41 U/L 21  ALT 14 - 54 U/L 12(L)     Discharge instruction: per After Visit Summary and "Baby and Me Booklet".  After Visit Meds:    Medication List    TAKE these medications        FUSION PLUS Caps  Take 1 capsule by mouth daily.     ibuprofen 600 MG tablet  Commonly known as:  ADVIL,MOTRIN  Take 1 tablet (600 mg total) by mouth every 6 (six) hours as needed.     labetalol 100 MG tablet  Commonly known as:  NORMODYNE  Take 1 tablet (100 mg total) by mouth 2 (two) times daily.     prenatal multivitamin Tabs tablet  Take 1 tablet by mouth daily at 12 noon.        Diet: routine diet  Activity: Advance as tolerated. Pelvic rest for 6 weeks.   Outpatient follow up:6 weeks--[facility] RN will see  patient on Monday, 08/16/15, for BP check. Follow up Appt:No future appointments. Follow up visit: No Follow-up on file.  Postpartum contraception: Depo Provera at 6 weeks. Patient will call for Depo Rx prior to 6 week visit.  08/13/2015 Nigel BridgemanLATHAM, Rosemond Lyttle, CNM

## 2015-08-13 NOTE — Discharge Instructions (Signed)
Postpartum Care After Vaginal Delivery After you deliver your newborn (postpartum period), the usual stay in the hospital is 24-72 hours. If there were problems with your labor or delivery, or if you have other medical problems, you might be in the hospital longer.  While you are in the hospital, you will receive help and instructions on how to care for yourself and your newborn during the postpartum period.  While you are in the hospital:  Be sure to tell your nurses if you have pain or discomfort, as well as where you feel the pain and what makes the pain worse.  If you had an incision made near your vagina (episiotomy) or if you had some tearing during delivery, the nurses may put ice packs on your episiotomy or tear. The ice packs may help to reduce the pain and swelling.  If you are breastfeeding, you may feel uncomfortable contractions of your uterus for a couple of weeks. This is normal. The contractions help your uterus get back to normal size.  It is normal to have some bleeding after delivery.  For the first 1-3 days after delivery, the flow is red and the amount may be similar to a period.  It is common for the flow to start and stop.  In the first few days, you may pass some small clots. Let your nurses know if you begin to pass large clots or your flow increases.  Do not  flush blood clots down the toilet before having the nurse look at them.  During the next 3-10 days after delivery, your flow should become more watery and pink or brown-tinged in color.  Ten to fourteen days after delivery, your flow should be a small amount of yellowish-white discharge.  The amount of your flow will decrease over the first few weeks after delivery. Your flow may stop in 6-8 weeks. Most women have had their flow stop by 12 weeks after delivery.  You should change your sanitary pads frequently.  Wash your hands thoroughly with soap and water for at least 20 seconds after changing pads, using  the toilet, or before holding or feeding your newborn.  You should feel like you need to empty your bladder within the first 6-8 hours after delivery.  In case you become weak, lightheaded, or faint, call your nurse before you get out of bed for the first time and before you take a shower for the first time.  Within the first few days after delivery, your breasts may begin to feel tender and full. This is called engorgement. Breast tenderness usually goes away within 48-72 hours after engorgement occurs. You may also notice milk leaking from your breasts. If you are not breastfeeding, do not stimulate your breasts. Breast stimulation can make your breasts produce more milk.  Spending as much time as possible with your newborn is very important. During this time, you and your newborn can feel close and get to know each other. Having your newborn stay in your room (rooming in) will help to strengthen the bond with your newborn. It will give you time to get to know your newborn and become comfortable caring for your newborn.  Your hormones change after delivery. Sometimes the hormone changes can temporarily cause you to feel sad or tearful. These feelings should not last more than a few days. If these feelings last longer than that, you should talk to your caregiver.  If desired, talk to your caregiver about methods of family planning or contraception.  Talk to your caregiver about immunizations. Your caregiver may want you to have the following immunizations before leaving the hospital:  Tetanus, diphtheria, and pertussis (Tdap) or tetanus and diphtheria (Td) immunization. It is very important that you and your family (including grandparents) or others caring for your newborn are up-to-date with the Tdap or Td immunizations.   Hypertension During Pregnancy/After Delivery Hypertension, or high blood pressure, is when there is extra pressure inside your blood vessels that carry blood from the heart  to the rest of your body (arteries). It can happen at any time in life, including pregnancy. Hypertension during pregnancy can cause problems for you and your baby. Your baby might not weigh as much as he or she should at birth or might be born early (premature). Very bad cases of hypertension during pregnancy can be life-threatening.  Different types of hypertension can occur during pregnancy. These include: Chronic hypertension. This happens when a woman has hypertension before pregnancy and it continues during pregnancy. Gestational hypertension. This is when hypertension develops during pregnancy. Preeclampsia or toxemia of pregnancy. This is a very serious type of hypertension that develops only during pregnancy. It affects the whole body and can be very dangerous for both mother and baby.  Gestational hypertension and preeclampsia usually go away after your baby is born. Your blood pressure will likely stabilize within 6 weeks. Women who have hypertension during pregnancy have a greater chance of developing hypertension later in life or with future pregnancies. RISK FACTORS There are certain factors that make it more likely for you to develop hypertension during pregnancy. These include: Having hypertension before pregnancy. Having hypertension during a previous pregnancy. Being overweight. Being older than 40 years. Being pregnant with more than one baby. Having diabetes or kidney problems. SIGNS AND SYMPTOMS Chronic and gestational hypertension rarely cause symptoms. Preeclampsia has symptoms, which may include: Increased protein in your urine. Your health care provider will check for this at every prenatal visit. Swelling of your hands and face. Rapid weight gain. Headaches. Visual changes. Being bothered by light. Abdominal pain, especially in the upper right area. Chest pain. Shortness of breath. Increased reflexes. Seizures. These occur with a more severe form of preeclampsia,  called eclampsia. DIAGNOSIS  You may be diagnosed with hypertension during a regular prenatal exam. At each prenatal visit, you may have: Your blood pressure checked. A urine test to check for protein in your urine. The type of hypertension you are diagnosed with depends on when you developed it. It also depends on your specific blood pressure reading. Developing hypertension before 20 weeks of pregnancy is consistent with chronic hypertension. Developing hypertension after 20 weeks of pregnancy is consistent with gestational hypertension. Hypertension with increased urinary protein is diagnosed as preeclampsia. Blood pressure measurements that stay above 161 systolic or 096 diastolic are a sign of severe preeclampsia. TREATMENT Treatment for hypertension during pregnancy varies. Treatment depends on the type of hypertension and how serious it is. If you take medicine for chronic hypertension, you may need to switch medicines. Medicines called ACE inhibitors should not be taken during pregnancy. Low-dose aspirin may be suggested for women who have risk factors for preeclampsia. If you have gestational hypertension, you may need to take a blood pressure medicine that is safe during pregnancy. Your health care provider will recommend the correct medicine. If you have severe preeclampsia, you may need to be in the hospital. Health care providers will watch you and your baby very closely. You also may need to take  medicine called magnesium sulfate to prevent seizures and lower blood pressure. Sometimes, an early delivery is needed. This may be the case if the condition worsens. It would be done to protect you and your baby. The only cure for preeclampsia is delivery. Your health care provider may recommend that you take one low-dose aspirin (81 mg) each day to help prevent high blood pressure during your pregnancy if you are at risk for preeclampsia. You may be at risk for preeclampsia if: You had  preeclampsia or eclampsia during a previous pregnancy. Your baby did not grow as expected during a previous pregnancy. You experienced preterm birth with a previous pregnancy. You experienced a separation of the placenta from the uterus (placental abruption) during a previous pregnancy. You experienced the loss of your baby during a previous pregnancy. You are pregnant with more than one baby. You have other medical conditions, such as diabetes or an autoimmune disease. HOME CARE INSTRUCTIONS Schedule and keep all of your regular prenatal care appointments. This is important. Take medicines only as directed by your health care provider. Tell your health care provider about all medicines you take. Eat as little salt as possible. Get regular exercise. Do not drink alcohol. Do not use tobacco products. Do not drink products with caffeine. Lie on your left side when resting. SEEK IMMEDIATE MEDICAL CARE IF: You have severe abdominal pain. You have sudden swelling in your hands, ankles, or face. You gain 4 pounds (1.8 kg) or more in 1 week. You vomit repeatedly. You have vaginal bleeding. You have a headache. You have blurred or double vision. You have muscle twitching or spasms. You have shortness of breath. You have blue fingernails or lips. You have blood in your urine. MAKE SURE YOU: Understand these instructions. Will watch your condition. Will get help right away if you are not doing well or get worse.   This information is not intended to replace advice given to you by your health care provider. Make sure you discuss any questions you have with your health care provider.  Labetalol tablets  What is this medicine? LABETALOL (la BET a lole) is a beta-blocker. Beta-blockers reduce the workload on the heart and help it to beat more regularly. This medicine is used to treat high blood pressure. This medicine may be used for other purposes; ask your health care provider or  pharmacist if you have questions. What should I tell my health care provider before I take this medicine? They need to know if you have any of these conditions: -diabetes -history of heart attack, heart disease or heart failure -kidney disease -liver disease -lung or breathing disease, like asthma or emphysema -pheochromocytoma -thyroid disease -an unusual or allergic reaction to labetalol, other beta-blockers, medicines, foods, dyes, or preservatives -pregnant or trying to get pregnant -breast-feeding How should I use this medicine? Take this medicine by mouth with a glass of water. Follow the directions on the prescription label. Take your doses at regular intervals. Do not take your medicine more often than directed. Do not stop taking this medicine suddenly. This could lead to serious heart-related effects. Talk to your pediatrician regarding the use of this medicine in children. Special care may be needed. Overdosage: If you think you have taken too much of this medicine contact a poison control center or emergency room at once. NOTE: This medicine is only for you. Do not share this medicine with others. What if I miss a dose? If you miss a dose, take it as  soon as you can. If it is almost time for your next dose, take only that dose. Do not take double or extra doses. What may interact with this medicine? This medicine also interact with the following medications: -certain medicines for blood pressure, heart disease, irregular heart beat -cimetidine -general anesthetics -medicines for asthma or lung disease like albuterol -medicines for depression -nitroglycerin This list may not describe all possible interactions. Give your health care provider a list of all the medicines, herbs, non-prescription drugs, or dietary supplements you use. Also tell them if you smoke, drink alcohol, or use illegal drugs. Some items may interact with your medicine. What should I watch for while using  this medicine? Visit your doctor or health care professional for regular check ups. Check your blood pressure and pulse rate regularly. Ask your health care professional what your blood pressure and pulse rate should be, and when you should contact him or her. You may get drowsy or dizzy. Do not drive, use machinery, or do anything that needs mental alertness until you know how this medicine affects you. Do not stand or sit up quickly. Alcohol may interfere with the effect of this medicine. Avoid alcoholic drinks. This medicine can affect blood sugar levels. If you have diabetes, check with your doctor or health care professional before you change your diet or the dose of your diabetic medicine. Do not treat yourself for coughs, colds, or pain while you are taking this medicine without asking your doctor or health care professional for advice. Some ingredients may increase your blood pressure. What side effects may I notice from receiving this medicine? Side effects that you should report to your doctor or health care professional as soon as possible: -allergic reactions like skin rash, itching or hives, swelling of the face, lips, or tongue -breathing problems -cold hands or feet -dark urine -depression -general ill feeling or flu-like symptoms -irregular heartbeat -light-colored stools -loss of appetite, nausea -pain or trouble passing urine -right upper belly pain -slow heart rate (fewer than recommended by your doctor or health care professional) -swollen legs or ankles -tingling of the scalp or skin -unusually weak or tired -vomiting -yellowing of the eyes or skin Side effects that usually do not require medical attention (report to your doctor or health care professional if they continue or are bothersome): -decreased sexual function or desire -dry itching skin -headache -tiredness This list may not describe all possible side effects. Call your doctor for medical advice about side  effects. You may report side effects to FDA at 1-800-FDA-1088. Where should I keep my medicine? Keep out of the reach of children. Store at room temperature between 15 and 30 degrees C (59 and 86 degrees F). Protect from light. Keep container tightly closed. Throw away any unused medicine after the expiration date. NOTE: This sheet is a summary. It may not cover all possible information. If you have questions about this medicine, talk to your doctor, pharmacist, or health care provider.    2016, Elsevier/Gold Standard. (2013-01-03 14:34:23)   Iron-Rich Diet  Iron is a mineral that helps your body to produce hemoglobin. Hemoglobin is a protein in your red blood cells that carries oxygen to your body's tissues. Eating too little iron may cause you to feel weak and tired, and it can increase your risk for infection. Eating enough iron is necessary for your body's metabolism, muscle function, and nervous system. Iron is naturally found in many foods. It can also be added to foods or fortified  in foods. There are two types of dietary iron:  Heme iron. Heme iron is absorbed by the body more easily than nonheme iron. Heme iron is found in meat, poultry, and fish.  Nonheme iron. Nonheme iron is found in dietary supplements, iron-fortified grains, beans, and vegetables. You may need to follow an iron-rich diet if:  You have been diagnosed with iron deficiency or iron-deficiency anemia.  You have a condition that prevents you from absorbing dietary iron, such as:  Infection in your intestines.  Celiac disease. This involves long-lasting (chronic) inflammation of your intestines.  You do not eat enough iron.  You eat a diet that is high in foods that impair iron absorption.  You have lost a lot of blood.  You have heavy bleeding during your menstrual cycle.  You are pregnant. WHAT IS MY PLAN? Your health care provider may help you to determine how much iron you need per day based on your  condition. Generally, when a person consumes sufficient amounts of iron in the diet, the following iron needs are met:  Men.  6-10 years old: 11 mg per day.  63-44 years old: 8 mg per day.  Women.   51-20 years old: 15 mg per day.  67-81 years old: 18 mg per day.  Over 72 years old: 8 mg per day.  Pregnant women: 27 mg per day.  Breastfeeding women: 9 mg per day. WHAT DO I NEED TO KNOW ABOUT AN IRON-RICH DIET?  Eat fresh fruits and vegetables that are high in vitamin C along with foods that are high in iron. This will help increase the amount of iron that your body absorbs from food, especially with foods containing nonheme iron. Foods that are high in vitamin C include oranges, peppers, tomatoes, and mango.  Take iron supplements only as directed by your health care provider. Overdose of iron can be life-threatening. If you were prescribed iron supplements, take them with orange juice or a vitamin C supplement.  Cook foods in pots and pans that are made from iron.   Eat nonheme iron-containing foods alongside foods that are high in heme iron. This helps to improve your iron absorption.   Certain foods and drinks contain compounds that impair iron absorption. Avoid eating these foods in the same meal as iron-rich foods or with iron supplements. These include:  Coffee, black tea, and red wine.  Milk, dairy products, and foods that are high in calcium.  Beans, soybeans, and peas.  Whole grains.  When eating foods that contain both nonheme iron and compounds that impair iron absorption, follow these tips to absorb iron better.   Soak beans overnight before cooking.  Soak whole grains overnight and drain them before using.  Ferment flours before baking, such as using yeast in bread dough. WHAT FOODS CAN I EAT? Grains Iron-fortified breakfast cereal. Iron-fortified whole-wheat bread. Enriched rice. Sprouted grains. Vegetables Spinach. Potatoes with skin. Green  peas. Broccoli. Red and green bell peppers. Fermented vegetables. Fruits Prunes. Raisins. Oranges. Strawberries. Mango. Grapefruit. Meats and Other Protein Sources Beef liver. Oysters. Beef. Shrimp. [place]. Chicken. [place]. Sardines. Chickpeas. Nuts. Tofu. Beverages Tomato juice. Fresh orange juice. Prune juice. Hibiscus tea. Fortified instant breakfast shakes. Condiments Tahini. Fermented soy sauce. Sweets and Desserts Black-strap molasses.  Other Wheat germ. The items listed above may not be a complete list of recommended foods or beverages. Contact your dietitian for more options. WHAT FOODS ARE NOT RECOMMENDED? Grains Whole grains. Bran cereal. Bran flour. Oats. Vegetables Artichokes. Brussels  sprouts. Kale. Fruits Blueberries. Raspberries. Strawberries. Figs. Meats and Other Protein Sources Soybeans. Products made from soy protein. Dairy Milk. Cream. Cheese. Yogurt. Cottage cheese. Beverages Coffee. Black tea. Red wine. Sweets and Desserts Cocoa. Chocolate. Ice cream. Other Basil. Oregano. Parsley. The items listed above may not be a complete list of foods and beverages to avoid. Contact your dietitian for more information.   This information is not intended to replace advice given to you by your health care provider. Make sure you discuss any questions you have with your health care provider.   Document Released: 12/13/2004 Document Revised: 05/22/2014 Document Reviewed: 11/26/2013 Elsevier Interactive Patient Education [employer].

## 2015-12-26 ENCOUNTER — Inpatient Hospital Stay (HOSPITAL_COMMUNITY)
Admission: AD | Admit: 2015-12-26 | Discharge: 2015-12-26 | Disposition: A | Discharge status: Home / Self Care | Payer: Medicaid Other | Source: Ambulatory Visit | Attending: Obstetrics and Gynecology | Admitting: Obstetrics and Gynecology

## 2015-12-26 ENCOUNTER — Encounter (HOSPITAL_COMMUNITY): Payer: Self-pay | Admitting: *Deleted

## 2015-12-26 DIAGNOSIS — R109 Unspecified abdominal pain: Secondary | ICD-10-CM | POA: Diagnosis present

## 2015-12-26 DIAGNOSIS — N760 Acute vaginitis: Secondary | ICD-10-CM | POA: Insufficient documentation

## 2015-12-26 DIAGNOSIS — B9689 Other specified bacterial agents as the cause of diseases classified elsewhere: Secondary | ICD-10-CM

## 2015-12-26 DIAGNOSIS — O26899 Other specified pregnancy related conditions, unspecified trimester: Secondary | ICD-10-CM

## 2015-12-26 DIAGNOSIS — R1010 Upper abdominal pain, unspecified: Secondary | ICD-10-CM | POA: Diagnosis not present

## 2015-12-26 HISTORY — DX: Gestational (pregnancy-induced) hypertension without significant proteinuria, unspecified trimester: O13.9

## 2015-12-26 LAB — CBC WITH DIFFERENTIAL/PLATELET
BASOS PCT: 0 %
Basophils Absolute: 0 10*3/uL (ref 0.0–0.1)
EOS ABS: 0.3 10*3/uL (ref 0.0–0.7)
Eosinophils Relative: 3 %
HCT: 29.9 % — ABNORMAL LOW (ref 36.0–46.0)
HEMOGLOBIN: 10.9 g/dL — AB (ref 12.0–15.0)
Lymphocytes Relative: 26 %
Lymphs Abs: 2.2 10*3/uL (ref 0.7–4.0)
MCH: 27.4 pg (ref 26.0–34.0)
MCHC: 36.5 g/dL — ABNORMAL HIGH (ref 30.0–36.0)
MCV: 75.1 fL — ABNORMAL LOW (ref 78.0–100.0)
Monocytes Absolute: 0.5 10*3/uL (ref 0.1–1.0)
Monocytes Relative: 5 %
NEUTROS ABS: 5.6 10*3/uL (ref 1.7–7.7)
NEUTROS PCT: 66 %
Platelets: 299 10*3/uL (ref 150–400)
RBC: 3.98 MIL/uL (ref 3.87–5.11)
RDW: 13.2 % (ref 11.5–15.5)
WBC: 8.6 10*3/uL (ref 4.0–10.5)

## 2015-12-26 LAB — URINALYSIS, ROUTINE W REFLEX MICROSCOPIC
BILIRUBIN URINE: NEGATIVE
Glucose, UA: NEGATIVE mg/dL
Hgb urine dipstick: NEGATIVE
KETONES UR: NEGATIVE mg/dL
LEUKOCYTES UA: NEGATIVE
NITRITE: NEGATIVE
Protein, ur: NEGATIVE mg/dL
SPECIFIC GRAVITY, URINE: 1.015 (ref 1.005–1.030)
pH: 7 (ref 5.0–8.0)

## 2015-12-26 LAB — POCT PREGNANCY, URINE: Preg Test, Ur: NEGATIVE

## 2015-12-26 LAB — WET PREP, GENITAL
TRICH WET PREP: NONE SEEN
YEAST WET PREP: NONE SEEN

## 2015-12-26 LAB — HIV ANTIBODY (ROUTINE TESTING W REFLEX): HIV Screen 4th Generation wRfx: NONREACTIVE

## 2015-12-26 LAB — HCG, QUANTITATIVE, PREGNANCY: HCG, BETA CHAIN, QUANT, S: 2 m[IU]/mL (ref <5)

## 2015-12-26 MED ORDER — METRONIDAZOLE 500 MG PO TABS: 500.0000 mg | ORAL_TABLET | Freq: Two times a day (BID) | ORAL | 0 refills | Status: AC

## 2015-12-26 MED ORDER — GI COCKTAIL ~~LOC~~
30.0000 mL | Freq: Once | ORAL | Status: AC
  Administered 2015-12-26: 30 mL via ORAL
  Filled 2015-12-26: qty 30

## 2015-12-26 NOTE — MAU Note (Signed)
Pain in my R mid to upper stomach for past hour. Pain woke me up. Denies bleeding or d/c. Some nausea but no vomiting or diarrhea

## 2015-12-26 NOTE — MAU Provider Note (Signed)
Chief Complaint: Abdominal Pain   First Provider Initiated Contact with Patient 12/26/15 0354    Patient is also in system as Brantley Fling (maiden name).    SUBJECTIVE Ann Farmer is a 20 y.o. G1P0 at Unknown GA by unsure LMP who presents to maternity admissions reporting abdominal pain which woke her up tonight.  States it is in upper and lower abdomen. Points to both sides.  + nausea.  Denies vomiting, fever, diarrhea, constipation or bleeding. + UPT at [facility]. Has had no blood work or [place] yet.  Just had a baby in March. Was seen in [facility] office in May.. She denies vaginal bleeding, vaginal itching/burning, urinary symptoms, h/a, dizziness, or fever/chills.     Abdominal Pain  This is a new problem. The current episode started today. The onset quality is sudden. The problem occurs intermittently. The problem has been unchanged. The pain is located in the generalized abdominal region, LLQ, LUQ, RLQ and RUQ. The pain is moderate. The quality of the pain is cramping, colicky and sharp. The abdominal pain does not radiate. Associated symptoms include nausea. Pertinent negatives include no anorexia, constipation, diarrhea, dysuria, fever, headaches or vomiting. The pain is aggravated by palpation. The pain is relieved by nothing. She has tried nothing for the symptoms. Recent vaginal delivery with preeclampsia in March   RN Note: Pain in my R mid to upper stomach for past hour. Pain woke me up. Denies bleeding or d/c. Some nausea but no vomiting or diarrhea   No past medical history on file. No past surgical history on file. Social History   Social History  . Marital status: Married    Spouse name: N/A  . Number of children: N/A  . Years of education: N/A   Occupational History  . Not on file.   Social History Main Topics  . Smoking status: Not on file  . Smokeless tobacco: Not on file  . Alcohol use Not on file  . Drug use: Unknown  . Sexual activity: Not on file    Other Topics Concern  . Not on file   Social History Narrative  . No narrative on file   No current facility-administered medications on file prior to encounter.    No current outpatient prescriptions on file prior to encounter.   Allergies not on file  I have reviewed patient's Past Medical Hx, Surgical Hx, Family Hx, Social Hx, medications and allergies.   ROS:  Review of Systems  Constitutional: Negative for fever.  Gastrointestinal: Positive for abdominal pain and nausea. Negative for anorexia, constipation, diarrhea and vomiting.  Genitourinary: Negative for dysuria.  Neurological: Negative for headaches.   Other systems negative  Physical Exam  Patient Vitals for the past 24 hrs:  BP Temp Pulse Resp Height Weight  12/26/15 0347 123/74 97.4 F (36.3 C) 88 22  (1.626 m) 80.5 kg (177 lb 6.4 oz)   Physical Exam  Constitutional: Well-developed, well-nourished female in no acute distress, but uncomfortable looking  Cardiovascular: normal rate and rhythm.  Respiratory: normal effort, lungs clear bilaterally GI: Abd soft, tender. Pos BS x 4   Some rebound tenderness mid abdomen. No significant guarding  Does not really localize anywhere in particular. MS: Extremities nontender, no edema, normal ROM Neurologic: Alert and oriented x 4.  GU: Neg CVAT.  PELVIC EXAM: Cervix pink, visually closed, without lesion, scant white creamy discharge, vaginal walls and external genitalia normal Bimanual exam: Cervix 0/long/high, firm, anterior, neg CMT, uterus tender, nonenlarged, adnexa  with tenderness bilaterally, but no enlargement, or mass    LAB RESULTS Results for orders placed or performed during the hospital encounter of 12/26/15 (from the past 24 hour(s))  Urinalysis, Routine w reflex microscopic ([facility])     Status: None   Collection Time: 12/26/15  4:00 AM  Result Value Ref Range   Color, Urine YELLOW YELLOW   APPearance CLEAR CLEAR   Specific Gravity, Urine  1.015 1.005 - 1.030   pH 7.0 5.0 - 8.0   Glucose, UA NEGATIVE NEGATIVE mg/dL   Hgb urine dipstick NEGATIVE NEGATIVE   Bilirubin Urine NEGATIVE NEGATIVE   Ketones, ur NEGATIVE NEGATIVE mg/dL   Protein, ur NEGATIVE NEGATIVE mg/dL   Nitrite NEGATIVE NEGATIVE   Leukocytes, UA NEGATIVE NEGATIVE  Wet prep, genital     Status: Abnormal   Collection Time: 12/26/15  4:00 AM  Result Value Ref Range   Yeast Wet Prep HPF POC NONE SEEN NONE SEEN   Trich, Wet Prep NONE SEEN NONE SEEN   Clue Cells Wet Prep HPF POC PRESENT (A) NONE SEEN   WBC, Wet Prep HPF POC MODERATE (A) NONE SEEN   Sperm PRESENT   Pregnancy, urine POC     Status: None   Collection Time: 12/26/15  4:05 AM  Result Value Ref Range   Preg Test, Ur NEGATIVE NEGATIVE  CBC with Differential/Platelet     Status: Abnormal   Collection Time: 12/26/15  4:16 AM  Result Value Ref Range   WBC 8.6 4.0 - 10.5 K/uL   RBC 3.98 3.87 - 5.11 MIL/uL   Hemoglobin 10.9 (L) 12.0 - 15.0 g/dL   HCT 16.129.9 (L) 09.636.0 - 04.546.0 %   MCV 75.1 (L) 78.0 - 100.0 fL   MCH 27.4 26.0 - 34.0 pg   MCHC 36.5 (H) 30.0 - 36.0 g/dL   RDW 40.913.2 81.111.5 - 91.415.5 %   Platelets 299 150 - 400 K/uL   Neutrophils Relative % 66 %   Neutro Abs 5.6 1.7 - 7.7 K/uL   Lymphocytes Relative 26 %   Lymphs Abs 2.2 0.7 - 4.0 K/uL   Monocytes Relative 5 %   Monocytes Absolute 0.5 0.1 - 1.0 K/uL   Eosinophils Relative 3 %   Eosinophils Absolute 0.3 0.0 - 0.7 K/uL   Basophils Relative 0 %   Basophils Absolute 0.0 0.0 - 0.1 K/uL  hCG, quantitative, pregnancy     Status: None   Collection Time: 12/26/15  4:16 AM  Result Value Ref Range   hCG, Beta Chain, Quant, S 2 <5 mIU/mL    IMAGING No results found.  MAU Management/MDM: Ordered usual first trimester r/o ectopic labs.   Pelvic exam and cultures done  US cancelled due to negative pregnancy test while I wait on Quant HCG.  >>  HCG = 2. CBC reviewed:   Normal WBC with no left shift, so will try a GI cocktail.  Patient is now resting  comfortably, pain has subsided somewhat.  >>  Pain is now completely gone.  Consult Dr Stefano GaulStringer with presentation, exam findings, and results.   Treatments in MAU included GI cocktail.  Discussed with patient that we cannot know if this was a miscarriage or false + test.  This bleeding/pain could have represented a normal pregnancy with bleeding, spontaneous abortion or even an ectopic which can be life-threatening.  The process as listed above helps to determine which of these is present.    ASSESSMENT False positive pregnancy test vs SAB Abdominal  pain, likely GI, probably related to high fat food intake Bacterial vaginosis  PLAN Discharge home Follow up with [facility] as needed     Medication List    You have not been prescribed any medications.     Pt stable at time of discharge. Encouraged to return here or to other Urgent Care/ED if she develops worsening of symptoms, increase in pain, fever, or other concerning symptoms.    Wynelle Bourgeois CNM, MSN Certified Nurse-Midwife 12/26/2015  3:54 AM

## 2015-12-26 NOTE — Discharge Instructions (Signed)
Bacterial Vaginosis °Bacterial vaginosis is a vaginal infection that occurs when the normal balance of bacteria in the vagina is disrupted. It results from an overgrowth of certain bacteria. This is the most common vaginal infection in women of childbearing age. Treatment is important to prevent complications, especially in pregnant women, as it can cause a premature delivery. °CAUSES  °Bacterial vaginosis is caused by an increase in harmful bacteria that are normally present in smaller amounts in the vagina. Several different kinds of bacteria can cause bacterial vaginosis. However, the reason that the condition develops is not fully understood. °RISK FACTORS °Certain activities or behaviors can put you at an increased risk of developing bacterial vaginosis, including: °· Having a new sex partner or multiple sex partners. °· Douching. °· Using an intrauterine device (IUD) for contraception. °Women do not get bacterial vaginosis from toilet seats, bedding, swimming pools, or contact with objects around them. °SIGNS AND SYMPTOMS  °Some women with bacterial vaginosis have no signs or symptoms. Common symptoms include: °· Grey vaginal discharge. °· A fishlike odor with discharge, especially after sexual intercourse. °· Itching or burning of the vagina and vulva. °· Burning or pain with urination. °DIAGNOSIS  °Your health care provider will take a medical history and examine the vagina for signs of bacterial vaginosis. A sample of vaginal fluid may be taken. Your health care provider will look at this sample under a microscope to check for bacteria and abnormal cells. A vaginal pH test may also be done.  °TREATMENT  °Bacterial vaginosis may be treated with antibiotic medicines. These may be given in the form of a pill or a vaginal cream. A second round of antibiotics may be prescribed if the condition comes back after treatment. Because bacterial vaginosis increases your risk for sexually transmitted diseases, getting  treated can help reduce your risk for chlamydia, gonorrhea, HIV, and herpes. °HOME CARE INSTRUCTIONS  °· Only take over-the-counter or prescription medicines as directed by your health care provider. °· If antibiotic medicine was prescribed, take it as directed. Make sure you finish it even if you start to feel better. °· Tell all sexual partners that you have a vaginal infection. They should see their health care provider and be treated if they have problems, such as a mild rash or itching. °· During treatment, it is important that you follow these instructions: °· Avoid sexual activity or use condoms correctly. °· Do not douche. °· Avoid alcohol as directed by your health care provider. °· Avoid breastfeeding as directed by your health care provider. °SEEK MEDICAL CARE IF:  °· Your symptoms are not improving after 3 days of treatment. °· You have increased discharge or pain. °· You have a fever. °MAKE SURE YOU:  °· Understand these instructions. °· Will watch your condition. °· Will get help right away if you are not doing well or get worse. °FOR MORE INFORMATION  °Centers for Disease Control and Prevention, Division of STD Prevention: www.cdc.gov/std °American Sexual Health Association (ASHA): www.ashastd.org  °  °This information is not intended to replace advice given to you by your health care provider. Make sure you discuss any questions you have with your health care provider. °  °Document Released: 05/01/2005 Document Revised: 05/22/2014 Document Reviewed: 12/11/2012 °Elsevier Interactive Patient Education ©2016 Elsevier Inc. ° °Abdominal Pain, Adult °Many things can cause belly (abdominal) pain. Most times, the belly pain is not dangerous. Many cases of belly pain can be watched and treated at home. °HOME CARE  °· Do not   not take medicines that help you go poop (laxatives) unless told to by your doctor.  Only take medicine as told by your doctor.  Eat or drink as told by your doctor. Your doctor will  tell you if you should be on a special diet. GET HELP IF:  You do not know what is causing your belly pain.  You have belly pain while you are sick to your stomach (nauseous) or have runny poop (diarrhea).  You have pain while you pee or poop.  Your belly pain wakes you up at night.  You have belly pain that gets worse or better when you eat.  You have belly pain that gets worse when you eat fatty foods.  You have a fever. GET HELP RIGHT AWAY IF:   The pain does not go away within 2 hours.  You keep throwing up (vomiting).  The pain changes and is only in the right or left part of the belly.  You have bloody or tarry looking poop. MAKE SURE YOU:   Understand these instructions.  Will watch your condition.  Will get help right away if you are not doing well or get worse.   This information is not intended to replace advice given to you by your health care provider. Make sure you discuss any questions you have with your health care provider.   Document Released: 10/18/2007 Document Revised: 05/22/2014 Document Reviewed: 01/08/2013 Elsevier Interactive Patient Education [employer]2016 Elsevier Inc.

## 2015-12-27 LAB — GC/CHLAMYDIA PROBE AMP (~~LOC~~) NOT AT ARMC
Chlamydia: NEGATIVE
NEISSERIA GONORRHEA: NEGATIVE

## 2016-05-03 ENCOUNTER — Emergency Department (HOSPITAL_BASED_OUTPATIENT_CLINIC_OR_DEPARTMENT_OTHER)
Admission: EM | Admit: 2016-05-03 | Discharge: 2016-05-03 | Disposition: A | Discharge status: Home / Self Care | Payer: Medicaid Other | Attending: Physician Assistant | Admitting: Physician Assistant

## 2016-05-03 ENCOUNTER — Encounter (HOSPITAL_COMMUNITY): Payer: Self-pay | Admitting: Vascular Surgery

## 2016-05-03 ENCOUNTER — Emergency Department (HOSPITAL_COMMUNITY)
Admission: EM | Admit: 2016-05-03 | Discharge: 2016-05-03 | Disposition: A | Discharge status: Home / Self Care | Payer: Medicaid Other | Attending: Dermatology | Admitting: Dermatology

## 2016-05-03 ENCOUNTER — Encounter (HOSPITAL_BASED_OUTPATIENT_CLINIC_OR_DEPARTMENT_OTHER): Payer: Self-pay | Admitting: Emergency Medicine

## 2016-05-03 DIAGNOSIS — R109 Unspecified abdominal pain: Secondary | ICD-10-CM | POA: Insufficient documentation

## 2016-05-03 DIAGNOSIS — R079 Chest pain, unspecified: Secondary | ICD-10-CM | POA: Insufficient documentation

## 2016-05-03 DIAGNOSIS — Z5321 Procedure and treatment not carried out due to patient leaving prior to being seen by health care provider: Secondary | ICD-10-CM | POA: Insufficient documentation

## 2016-05-03 DIAGNOSIS — Z3A01 Less than 8 weeks gestation of pregnancy: Secondary | ICD-10-CM | POA: Insufficient documentation

## 2016-05-03 DIAGNOSIS — H539 Unspecified visual disturbance: Secondary | ICD-10-CM | POA: Diagnosis not present

## 2016-05-03 DIAGNOSIS — Y92410 Unspecified street and highway as the place of occurrence of the external cause: Secondary | ICD-10-CM | POA: Diagnosis not present

## 2016-05-03 DIAGNOSIS — Y999 Unspecified external cause status: Secondary | ICD-10-CM | POA: Diagnosis not present

## 2016-05-03 DIAGNOSIS — M549 Dorsalgia, unspecified: Secondary | ICD-10-CM | POA: Insufficient documentation

## 2016-05-03 DIAGNOSIS — Y9389 Activity, other specified: Secondary | ICD-10-CM | POA: Diagnosis not present

## 2016-05-03 DIAGNOSIS — M542 Cervicalgia: Secondary | ICD-10-CM | POA: Insufficient documentation

## 2016-05-03 DIAGNOSIS — M545 Low back pain: Secondary | ICD-10-CM | POA: Diagnosis present

## 2016-05-03 DIAGNOSIS — Z3490 Encounter for supervision of normal pregnancy, unspecified, unspecified trimester: Secondary | ICD-10-CM

## 2016-05-03 DIAGNOSIS — O9A211 Injury, poisoning and certain other consequences of external causes complicating pregnancy, first trimester: Secondary | ICD-10-CM | POA: Insufficient documentation

## 2016-05-03 LAB — PREGNANCY, URINE: PREG TEST UR: POSITIVE — AB

## 2016-05-03 MED ORDER — ACETAMINOPHEN 500 MG PO TABS
1000.0000 mg | ORAL_TABLET | Freq: Once | ORAL | Status: AC
  Administered 2016-05-03: 1000 mg via ORAL
  Filled 2016-05-03: qty 2
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
## 2016-05-03 NOTE — Discharge Instructions (Signed)
Take 1000 mg of Tylenol every 6 hours for pain. [facility] Return to the emergency department for worsening pain including chest pain, shortness of breath, abdominal pain, vaginal bleeding, or any new or concerning symptoms.
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
## 2016-05-15 NOTE — L&D Delivery Note (Signed)
Delivery Note At 3:36 AM a viable and healthy female was delivered via Vaginal, Spontaneous Delivery (Presentation: ROA;  ).  APGAR: 9, 9; weight: pending.   Placenta status:intact , .  Cord: 3 vessel  Anesthesia:  epidural Episiotomy: None Lacerations:  N/a   Est. Blood Loss (mL): 150  Mom to postpartum.  Baby to Couplet care / Skin to Skin.  Ann Farmer 12/21/2016, 4:08 AM  Patient is a G3P1011 at 2041w5d who was admitted with PROM, uncomplicated prenatal course.  She progressed with augmentation via foley and Pit.  I was gloved and present for delivery in its entirety.  Second stage of labor progressed, baby delivered after a few contractions.  No decels during second stage noted.  Complications: none  Lacerations: none  EBL: 150cc  SHAW, KIMBERLY, CNM 4:16 AM 12/21/2016

## 2016-07-06 ENCOUNTER — Encounter: Payer: Self-pay | Admitting: Obstetrics

## 2016-07-06 ENCOUNTER — Ambulatory Visit (INDEPENDENT_AMBULATORY_CARE_PROVIDER_SITE_OTHER): Payer: Medicaid Other | Admitting: Obstetrics

## 2016-07-06 ENCOUNTER — Other Ambulatory Visit (HOSPITAL_COMMUNITY)
Admission: RE | Admit: 2016-07-06 | Discharge: 2016-07-06 | Disposition: A | Discharge status: Home / Self Care | Payer: Medicaid Other | Source: Ambulatory Visit | Attending: Obstetrics | Admitting: Obstetrics

## 2016-07-06 VITALS — BP 126/73 | HR 90 | Wt 179.8 lb

## 2016-07-06 DIAGNOSIS — Z3482 Encounter for supervision of other normal pregnancy, second trimester: Secondary | ICD-10-CM | POA: Diagnosis not present

## 2016-07-06 DIAGNOSIS — Z3490 Encounter for supervision of normal pregnancy, unspecified, unspecified trimester: Secondary | ICD-10-CM | POA: Insufficient documentation

## 2016-07-06 DIAGNOSIS — Z3480 Encounter for supervision of other normal pregnancy, unspecified trimester: Secondary | ICD-10-CM

## 2016-07-06 DIAGNOSIS — K5901 Slow transit constipation: Secondary | ICD-10-CM

## 2016-07-06 DIAGNOSIS — O219 Vomiting of pregnancy, unspecified: Secondary | ICD-10-CM

## 2016-07-06 DIAGNOSIS — Z01419 Encounter for gynecological examination (general) (routine) without abnormal findings: Secondary | ICD-10-CM | POA: Diagnosis present

## 2016-07-06 DIAGNOSIS — Z113 Encounter for screening for infections with a predominantly sexual mode of transmission: Secondary | ICD-10-CM | POA: Diagnosis present

## 2016-07-06 MED ORDER — DOCUSATE SODIUM 100 MG PO CAPS: 100.0000 mg | ORAL_CAPSULE | Freq: Two times a day (BID) | ORAL | 5 refills | Status: DC

## 2016-07-06 MED ORDER — DOXYLAMINE-PYRIDOXINE 10-10 MG PO TBEC: DELAYED_RELEASE_TABLET | ORAL | 5 refills | Status: DC

## 2016-07-06 NOTE — Patient Instructions (Addendum)

## 2016-07-06 NOTE — Progress Notes (Signed)
Subjective:  Ann Farmer is a 21 y.o. G3P1011 at 5829w5d being seen today for ongoing prenatal care.  She is currently monitored for the following issues for this low-risk pregnancy and has Supervision of normal pregnancy, antepartum on her problem list.  Patient reports nausea.  Contractions: Not present. Vag. Bleeding: None.   . Denies leaking of fluid.   The following portions of the patient's history were reviewed and updated as appropriate: allergies, current medications, past family history, past medical history, past social history, past surgical history and problem list. Problem list updated.  Objective:   Vitals:   07/06/16 1510  BP: 126/73  Pulse: 90  Weight: 179 lb 12.8 oz (81.6 kg)    Fetal Status: Fetal Heart Rate (bpm): 150         General:  Alert, oriented and cooperative. Patient is in no acute distress.  Skin: Skin is warm and dry. No rash noted.   Cardiovascular: Normal heart rate noted  Respiratory: Normal respiratory effort, no problems with respiration noted  Abdomen: Soft, gravid, appropriate for gestational age. Pain/Pressure: Absent     Pelvic:  Cervical exam deferred        Extremities: Normal range of motion.  Edema: None  Mental Status: Normal mood and affect. Normal behavior. Normal judgment and thought content.   Urinalysis:      Assessment and Plan:  Pregnancy: G3P1011 at 3629w5d  1. Supervision of other normal pregnancy, antepartum Rx: - Cytology - PAP - Cervicovaginal ancillary only - HIV antibody - Hemoglobinopathy evaluation - Varicella zoster antibody, IgG - Culture, OB Urine - Prenatal Profile I - VITAMIN D 25 Hydroxy (Vit-D Deficiency, Fractures) - docusate sodium (COLACE) 100 MG capsule; Take 1 capsule (100 mg total) by mouth 2 (two) times daily.  Dispense: 60 capsule; Refill: 5  2. Nausea and vomiting in pregnancy prior to [redacted] weeks gestation Rx: - Doxylamine-Pyridoxine (DICLEGIS) 10-10 MG TBEC; 1 tab in AM, 1 tab mid afternoon 2  tabs at bedtime. Max dose 4 tabs daily.  Dispense: 100 tablet; Refill: 5  3. Constipation by delayed colonic transit Rx: - docusate sodium (COLACE) 100 MG capsule; Take 1 capsule (100 mg total) by mouth 2 (two) times daily.  Dispense: 60 capsule; Refill: 5  Preterm labor symptoms and general obstetric precautions including but not limited to vaginal bleeding, contractions, leaking of fluid and fetal movement were reviewed in detail with the patient. Please refer to After Visit Summary for other counseling recommendations.  No Follow-up on file.   Brock Badharles A Aloma Boch, MDPatient ID: Ann Fitchoyale D Dauber, female   DOB: 11/02/1995, 21 y.o.   MRN: [id number]

## 2016-07-06 NOTE — Progress Notes (Signed)
Patient reports she is doing well 

## 2016-07-07 LAB — CYTOLOGY - PAP: DIAGNOSIS: NEGATIVE

## 2016-07-07 LAB — CERVICOVAGINAL ANCILLARY ONLY
Bacterial vaginitis: NEGATIVE
CANDIDA VAGINITIS: NEGATIVE
CHLAMYDIA, DNA PROBE: NEGATIVE
NEISSERIA GONORRHEA: NEGATIVE
TRICH (WINDOWPATH): NEGATIVE

## 2016-07-10 ENCOUNTER — Encounter: Payer: Medicaid Other | Admitting: Obstetrics & Gynecology

## 2016-07-10 LAB — URINE CULTURE, OB REFLEX

## 2016-07-10 LAB — CULTURE, OB URINE

## 2016-07-11 LAB — PRENATAL PROFILE I(LABCORP)
ANTIBODY SCREEN: NEGATIVE
BASOS: 0 %
Basophils Absolute: 0 10*3/uL (ref 0.0–0.2)
EOS (ABSOLUTE): 0.1 10*3/uL (ref 0.0–0.4)
Eos: 1 %
HEMOGLOBIN: 11 g/dL — AB (ref 11.1–15.9)
HEP B S AG: NEGATIVE
Hematocrit: 31.5 % — ABNORMAL LOW (ref 34.0–46.6)
IMMATURE GRANULOCYTES: 0 %
Immature Grans (Abs): 0 10*3/uL (ref 0.0–0.1)
LYMPHS ABS: 1.9 10*3/uL (ref 0.7–3.1)
Lymphs: 17 %
MCH: 27.4 pg (ref 26.6–33.0)
MCHC: 34.9 g/dL (ref 31.5–35.7)
MCV: 78 fL — ABNORMAL LOW (ref 79–97)
MONOS ABS: 0.5 10*3/uL (ref 0.1–0.9)
Monocytes: 5 %
NEUTROS ABS: 8.6 10*3/uL — AB (ref 1.4–7.0)
NEUTROS PCT: 77 %
Platelets: 385 10*3/uL — ABNORMAL HIGH (ref 150–379)
RBC: 4.02 x10E6/uL (ref 3.77–5.28)
RDW: 14.6 % (ref 12.3–15.4)
RH TYPE: POSITIVE
RPR: NONREACTIVE
Rubella Antibodies, IGG: 14.5 index (ref >0.99)
WBC: 11.1 10*3/uL — AB (ref 3.4–10.8)

## 2016-07-11 LAB — HEMOGLOBINOPATHY EVALUATION
HEMOGLOBIN F QUANTITATION: 0 % (ref 0.0–2.0)
HGB A: 58 % — AB (ref 96.4–98.8)
HGB C: 38.8 % — ABNORMAL HIGH
HGB S: 0 %
HGB VARIANT: 0 %
Hemoglobin A2 Quantitation: 3.2 % (ref 1.8–3.2)

## 2016-07-11 LAB — VITAMIN D 25 HYDROXY (VIT D DEFICIENCY, FRACTURES): Vit D, 25-Hydroxy: 10.8 ng/mL — ABNORMAL LOW (ref 30.0–100.0)

## 2016-07-11 LAB — VARICELLA ZOSTER ANTIBODY, IGG: Varicella zoster IgG: <135 index — ABNORMAL LOW (ref >165)

## 2016-07-11 LAB — HIV ANTIBODY (ROUTINE TESTING W REFLEX): HIV Screen 4th Generation wRfx: NONREACTIVE

## 2016-07-19 ENCOUNTER — Other Ambulatory Visit: Payer: Self-pay | Admitting: *Deleted

## 2016-07-19 ENCOUNTER — Telehealth: Payer: Self-pay

## 2016-07-19 DIAGNOSIS — Z3492 Encounter for supervision of normal pregnancy, unspecified, second trimester: Secondary | ICD-10-CM

## 2016-07-19 NOTE — Progress Notes (Signed)
Hematology referral entered per Dr Clearance CootsHarper order. See lab note.

## 2016-07-19 NOTE — Telephone Encounter (Signed)
Returned call and advised of results and referral, transfered to scheduler.

## 2016-07-20 ENCOUNTER — Encounter: Payer: Self-pay | Admitting: Certified Nurse Midwife

## 2016-07-20 ENCOUNTER — Ambulatory Visit (INDEPENDENT_AMBULATORY_CARE_PROVIDER_SITE_OTHER): Payer: Medicaid Other | Admitting: Certified Nurse Midwife

## 2016-07-20 VITALS — BP 116/68 | HR 81 | Wt 180.6 lb

## 2016-07-20 DIAGNOSIS — R7989 Other specified abnormal findings of blood chemistry: Secondary | ICD-10-CM | POA: Insufficient documentation

## 2016-07-20 DIAGNOSIS — O09899 Supervision of other high risk pregnancies, unspecified trimester: Secondary | ICD-10-CM | POA: Insufficient documentation

## 2016-07-20 DIAGNOSIS — Z3483 Encounter for supervision of other normal pregnancy, third trimester: Secondary | ICD-10-CM

## 2016-07-20 DIAGNOSIS — Z283 Underimmunization status: Secondary | ICD-10-CM

## 2016-07-20 DIAGNOSIS — Z3400 Encounter for supervision of normal first pregnancy, unspecified trimester: Secondary | ICD-10-CM

## 2016-07-20 DIAGNOSIS — D582 Other hemoglobinopathies: Secondary | ICD-10-CM

## 2016-07-20 DIAGNOSIS — Z2839 Other underimmunization status: Secondary | ICD-10-CM

## 2016-07-20 MED ORDER — VITAMIN D (ERGOCALCIFEROL) 1.25 MG (50000 UNIT) PO CAPS: 50000.0000 [IU] | ORAL_CAPSULE | ORAL | 2 refills | Status: DC

## 2016-07-20 NOTE — Progress Notes (Signed)
   PRENATAL VISIT NOTE  Subjective:  Ann Farmer is a 21 y.o. G3P1011 at 276w5d being seen today for ongoing prenatal care.  She is currently monitored for the following issues for this low-risk pregnancy and has Supervision of normal pregnancy, antepartum; Hemoglobin C trait (HCC); Low vitamin D level; and Maternal varicella, non-immune on her problem list.  Patient reports no complaints.  Contractions: Not present. Vag. Bleeding: None.   . Denies leaking of fluid.   The following portions of the patient's history were reviewed and updated as appropriate: allergies, current medications, past family history, past medical history, past social history, past surgical history and problem list. Problem list updated.  Objective:   Vitals:   07/20/16 1527  BP: 116/68  Pulse: 81  Weight: 180 lb 9.6 oz (81.9 kg)    Fetal Status: Fetal Heart Rate (bpm): 152         General:  Alert, oriented and cooperative. Patient is in no acute distress.  Skin: Skin is warm and dry. No rash noted.   Cardiovascular: Normal heart rate noted  Respiratory: Normal respiratory effort, no problems with respiration noted  Abdomen: Soft, gravid, appropriate for gestational age. Pain/Pressure: Absent     Pelvic:  Cervical exam deferred        Extremities: Normal range of motion.  Edema: None  Mental Status: Normal mood and affect. Normal behavior. Normal judgment and thought content.   Assessment and Plan:  Pregnancy: G3P1011 at 706w5d  1. Supervision of normal first pregnancy, antepartum     Doing well.  - MaterniT21 PLUS Core+SCA - AFP, Serum, Open Spina Bifida - US MFM OB COMP + 14 WK; Future  2. Hemoglobin C trait (HCC)    Discussed with patient.  Mat21&AFP obtained in office today.  - AMB MFM GENETICS REFERRAL  3. Low vitamin D level      - Vitamin D, Ergocalciferol, (DRISDOL) 50000 units CAPS capsule; Take 1 capsule (50,000 Units total) by mouth every 7 (seven) days.  Dispense: 30 capsule; Refill:  2  4. Maternal varicella, non-immune    Varicella postpartum.  Preterm labor symptoms and general obstetric precautions including but not limited to vaginal bleeding, contractions, leaking of fluid and fetal movement were reviewed in detail with the patient. Please refer to After Visit Summary for other counseling recommendations.  Return in about 4 weeks (around 08/17/2016) for ROB.   Roe Coombsachelle A Anzel Kearse, CNM

## 2016-07-20 NOTE — Progress Notes (Signed)
Patient is having intense headaches- they come and go

## 2016-07-26 ENCOUNTER — Other Ambulatory Visit: Payer: Self-pay | Admitting: Certified Nurse Midwife

## 2016-07-26 DIAGNOSIS — Z3400 Encounter for supervision of normal first pregnancy, unspecified trimester: Secondary | ICD-10-CM

## 2016-07-26 LAB — MATERNIT21 PLUS CORE+SCA
CHROMOSOME 18: NEGATIVE
Chromosome 13: NEGATIVE
Chromosome 21: NEGATIVE
Y CHROMOSOME: DETECTED

## 2016-07-27 ENCOUNTER — Other Ambulatory Visit: Payer: Self-pay | Admitting: Certified Nurse Midwife

## 2016-07-27 DIAGNOSIS — Z3480 Encounter for supervision of other normal pregnancy, unspecified trimester: Secondary | ICD-10-CM

## 2016-07-27 LAB — AFP, SERUM, OPEN SPINA BIFIDA
AFP MoM: 0.61
AFP Value: 21.4 ng/mL
GEST. AGE ON COLLECTION DATE: 16.7 wk
MATERNAL AGE AT EDD: 23.1 a
OSBR Risk 1 IN: 10000
TEST RESULTS AFP: NEGATIVE
WEIGHT: 180 [lb_av]

## 2016-07-31 ENCOUNTER — Encounter (HOSPITAL_COMMUNITY): Payer: Self-pay | Admitting: Certified Nurse Midwife

## 2016-08-07 ENCOUNTER — Other Ambulatory Visit: Payer: Self-pay | Admitting: Certified Nurse Midwife

## 2016-08-07 ENCOUNTER — Ambulatory Visit (HOSPITAL_COMMUNITY)
Admission: RE | Admit: 2016-08-07 | Discharge: 2016-08-07 | Disposition: A | Discharge status: Home / Self Care | Payer: Medicaid Other | Source: Ambulatory Visit | Attending: Certified Nurse Midwife | Admitting: Certified Nurse Midwife

## 2016-08-07 DIAGNOSIS — Z3400 Encounter for supervision of normal first pregnancy, unspecified trimester: Secondary | ICD-10-CM

## 2016-08-07 DIAGNOSIS — O99212 Obesity complicating pregnancy, second trimester: Secondary | ICD-10-CM

## 2016-08-07 DIAGNOSIS — D582 Other hemoglobinopathies: Secondary | ICD-10-CM

## 2016-08-07 DIAGNOSIS — O09292 Supervision of pregnancy with other poor reproductive or obstetric history, second trimester: Secondary | ICD-10-CM | POA: Insufficient documentation

## 2016-08-07 DIAGNOSIS — Z3A19 19 weeks gestation of pregnancy: Secondary | ICD-10-CM | POA: Diagnosis not present

## 2016-08-07 DIAGNOSIS — Z363 Encounter for antenatal screening for malformations: Secondary | ICD-10-CM

## 2016-08-07 DIAGNOSIS — Z8759 Personal history of other complications of pregnancy, childbirth and the puerperium: Secondary | ICD-10-CM

## 2016-08-10 ENCOUNTER — Other Ambulatory Visit: Payer: Self-pay | Admitting: Certified Nurse Midwife

## 2016-08-10 ENCOUNTER — Ambulatory Visit (HOSPITAL_COMMUNITY)
Admission: RE | Admit: 2016-08-10 | Discharge: 2016-08-10 | Disposition: A | Discharge status: Home / Self Care | Payer: Medicaid Other | Source: Ambulatory Visit | Attending: Certified Nurse Midwife | Admitting: Certified Nurse Midwife

## 2016-08-10 DIAGNOSIS — D582 Other hemoglobinopathies: Secondary | ICD-10-CM

## 2016-08-10 DIAGNOSIS — Z3480 Encounter for supervision of other normal pregnancy, unspecified trimester: Secondary | ICD-10-CM

## 2016-08-10 NOTE — Progress Notes (Signed)
Genetic Counseling  High-Risk Gestation Note  Appointment Date:  08/10/2016 Referred By: Morene Crocker, CNM Date of Birth:  11-Nov-1995 Partner:  Barnabas Harries   Pregnancy History: [id number] Estimated Date of Delivery: 12/30/16 Estimated Gestational Age: 32w5dAttending: MGriffin Dakin MD   I met with Mrs. Ann Ochsfor genetic counseling because of a personal history of hemoglobin C trait for the patient.  In summary:  Reviewed family history and personal history positive for hemoglobin C  Patient reported multiple siblings to her with either hemoglobin C trait or sickle cell trait and one brother with hemoglobin [place] disease  Reviewed autosomal recessive inheritance of hemoglobinopathies  Father of the pregnancy has not yet had screening and has negative family history for hemoglobin variants  General population chance for sickle cell trait (Hb S) is approximately 1 in 12 and for Hemoglobin C trait is 1 in 30  Prior to screening, risk for hemoglobin [place] disease in the pregnancy is approximately 1 in 48  Discussed options of screening / testing - hemoglobin electrophoresis and/or molecular screening for father of the pregnancy available  Patient will discuss with her husband and contact our office if they desire [place] screening  If both parents are identified carriers of hemoglobin variants- amniocentesis would be available for prenatal diagnosis  Patient indicated she would not likely be interested in amniocentesis for hemoglobinopathy  Discussed newborn screening assesses for hemoglobin variants  Discussed additional family history concerns.   The patient had routine screening through her OB office, which identified her to have hemoglobin C trait (Hb AC, or Hb C trait). The patient reported that she has always known she has hemoglobin C trait given her family history. Her father has hemoglobin C trait, as do several of her siblings, and her mother has  hemoglobin S trait (sickle cell trait), as do some of the patient's siblings. She reported a brother and a nephew with hemoglobin [place] disease. The father of the pregnancy has no known family history of sickle cell trait, sickle cell disease, or hemoglobinopathies. He reportedly has African American ancestry and no consanguinity to the patient. Mrs. DKniesreported that her husband has not yet had screening for hemoglobin variants.   We reviewed that hemoglobin is the oxygen-carrying pigment of red blood cells.  The type of hemoglobin we have is determined by inheritance.  Most people typically inherit hemoglobin A.  Other variant types of hemoglobin include hemoglobin C or hemoglobin S.  If an individual inherits hemoglobin A from one parent and hemoglobin C from the other parent, that individual has hemoglobin C trait (noted as hemoglobin AC).  Likewise, an individual who inherits hemoglobin A from one parent and hemoglobin S from the other parent has hemoglobin S trait (sickle cell trait) (hemoglobin AS).  Hemoglobin C trait and sickle cell trait are not associated with any illness and there are generally no symptoms.  We reviewed genes and chromosomes. Mrs. DMarlarwas counseled that sickle cell anemia is inherited in an autosomal recessive manner, and occurs when both copies of the hemoglobin gene are changed and produce an abnormal hemoglobin. Typically, one abnormal gene for the production of hemoglobin S is inherited from each parent. Both the mother and the father of the baby must carry a variant hemoglobin such as hemoglobin S or C in order for a pregnancy to be at increased risk for a disease such as sickle cell disease (hemoglobin SS) or hemoglobin C disease (hemoglobin CC) in this pregnancy. When  both parents are carriers of an autosomal recessive condition, each pregnancy together has a 1 in 4 (25%) chance to inherit the condition, a 1 in 2 (50%) chance to be a carrier, and a 1 in 4 (25%) chance to  be neither a carrier nor affected. If one parent carriers hemoglobin C trait and the other has typical hemoglobin, a pregnancy would not be at increased risk for the condition but have a 1 in 2 (50%) chance to have hemoglobin C trait.   Individuals with sickle cell disease (sickle cell anemia) have red blood cells that are not able to carry enough oxygen to all the tissues of the body, which may result in sickle cell crises, delays in growth and development, repeated infections, or hospitalization for blood transfusions and pain management. Since Ann Farmer does not carry hemoglobin S, this and future pregnancies are not expected to be at risk to inherit sickle cell disease (hemoglobin SS). Individuals with hemoglobin [place] disease are typically described to have milder features than individuals with hemoglobin SS. Hemoglobin C disease is generally a benign condition, although it can be associated with mild hemolytic anemia and/or splenomegaly (enlarged spleen).  If an enlarged spleen is painful, sometimes a splenectomy (surgical removal of the spleen) is performed.  There appear to be no other symptoms associated with this hemoglobin C disease.   Given the recessive inheritance, we discussed the importance of understanding Mr. Liebert' carrier status in order to accurately predict the risk of a hemoglobinopathy in the fetus.  Prior to carrier screening, he would have the general population chance of approximately 1 in 12 to have sickle cell trait (Hb S), meaning that the pregnancy has an approximate 1 in 48 chance for hemoglobin  disease. Carrier frequency for hemoglobin C trait is approximately 1 in 30. When both parents are identified carriers, prenatal diagnosis via amniocentesis would be available. Ann Farmer indicated that she would not be interested in amniocentesis in the pregnancy. We discussed the option of testing via hemoglobin electrophoresis for Mr. Mccleary or via molecular testing to  determine whether he has any hemoglobin variant, including SCT. Ann Farmer planned to discuss this with her husband, who was not present for today's visit, and they will contact our office if they desire [place] to facilitate this screening for him. They understand that an accurate risk assessment cannot be performed without further information. We also reviewed the availability of newborn screening in [place] for hemoglobinopathies.   The father of the pregnancy reportedly had a brother with multiple medical issues. He died at age 71 years old. The patient had limited information at today's appointment but reported that he was born at approximately [redacted] weeks gestation, had organ issues involving his lungs and kidneys, was unable to walk independently, and was unable to eat solid food. She reported that he was able to speak, and she described him to possibly have some mild dysmorphic features. She reported that she thinks it is likely that a specific condition was identified for his features, but she was unsure of the name at today's visit. We discussed that additional information is needed regarding the etiology in order to accurately assess recurrence risk for relatives and possible available screening or testing. Further genetic counseling is warranted if more information is obtained.  The family histories were otherwise found to be contributory for a female paternal first cousin to the patient with Down syndrome. He died at age 68 years old related to respiratory problems. We  discussed that 95% of cases of Down syndrome are not inherited and are the result of non-disjunction.  Three to 4% of cases of Down syndrome are the result of a translocation involving chromosome #21.  We discussed the option of chromosome analysis to determine if an individual is a carrier of a balanced translocation involving chromosome #21.  If an individual carries a balanced translocation involving chromosome #21, then the chance  to have a baby with Down syndrome would be greater than the maternal age-related risk. The reported family history is most suggestive of sporadic Down syndrome.  Without further information regarding the provided family history, an accurate genetic risk cannot be calculated. Ms. Roan previously had noninvasive prenatal screening (NIPS)/prenatal cell free DNA testing through her OB. We reviewed that this screen, MaterniT21, was within normal range for the conditions screened, including trisomy 21. We reviewed the sensitivity of this screen for trisomy 62, trisomy 21, and trisomy 33.   Mrs. JOVAN SCHICKLING denied exposure to environmental toxins or chemical agents. She denied the use of alcohol, tobacco or street drugs. She denied significant viral illnesses during the course of her pregnancy. Her medical and surgical histories were otherwise noncontributory.   I counseled Mrs. Geralynn Farmer regarding the above risks and available options.  The approximate face-to-face time with the genetic counselor was  minutes.  Chipper Oman, MS Certified Genetic Counselor 08/10/2016

## 2016-08-17 ENCOUNTER — Encounter: Payer: Self-pay | Admitting: Certified Nurse Midwife

## 2016-08-17 ENCOUNTER — Ambulatory Visit (INDEPENDENT_AMBULATORY_CARE_PROVIDER_SITE_OTHER): Payer: Medicaid Other | Admitting: Certified Nurse Midwife

## 2016-08-17 VITALS — BP 113/71 | HR 92 | Wt 184.8 lb

## 2016-08-17 DIAGNOSIS — O09293 Supervision of pregnancy with other poor reproductive or obstetric history, third trimester: Secondary | ICD-10-CM | POA: Insufficient documentation

## 2016-08-17 DIAGNOSIS — Z3480 Encounter for supervision of other normal pregnancy, unspecified trimester: Secondary | ICD-10-CM

## 2016-08-17 DIAGNOSIS — E559 Vitamin D deficiency, unspecified: Secondary | ICD-10-CM

## 2016-08-17 DIAGNOSIS — R7989 Other specified abnormal findings of blood chemistry: Secondary | ICD-10-CM

## 2016-08-17 DIAGNOSIS — O09292 Supervision of pregnancy with other poor reproductive or obstetric history, second trimester: Secondary | ICD-10-CM

## 2016-08-17 MED ORDER — ASPIRIN 81 MG PO CHEW: 81.0000 mg | CHEWABLE_TABLET | Freq: Every day | ORAL | 5 refills | Status: DC

## 2016-08-17 NOTE — Progress Notes (Signed)
Some pain today- comes and goes. Patient has no concerns to report today

## 2016-08-17 NOTE — Progress Notes (Signed)
   PRENATAL VISIT NOTE  Subjective:  Ann Farmer is a 21 y.o. G3P1011 at [redacted]w[redacted]d being seen today for ongoing prenatal care.  She is currently monitored for the following issues for this low-risk pregnancy and has Supervision of normal pregnancy, antepartum; Hemoglobin C trait (HCC); Low vitamin D level; Maternal varicella, non-immune; and Hx of preeclampsia, prior pregnancy, currently pregnant, second trimester on her problem list.  Patient reports no complaints.  Contractions: Not present. Vag. Bleeding: None.   . Denies leaking of fluid.   The following portions of the patient's history were reviewed and updated as appropriate: allergies, current medications, past family history, past medical history, past social history, past surgical history and problem list. Problem list updated.  Objective:   Vitals:   08/17/16 1523  BP: 113/71  Pulse: 92  Weight: 184 lb 12.8 oz (83.8 kg)    Fetal Status: Fetal Heart Rate (bpm): 152         General:  Alert, oriented and cooperative. Patient is in no acute distress.  Skin: Skin is warm and dry. No rash noted.   Cardiovascular: Normal heart rate noted  Respiratory: Normal respiratory effort, no problems with respiration noted  Abdomen: Soft, gravid, appropriate for gestational age. Pain/Pressure: Present     Pelvic:  Cervical exam deferred        Extremities: Normal range of motion.  Edema: None  Mental Status: Normal mood and affect. Normal behavior. Normal judgment and thought content.   Assessment and Plan:  Pregnancy: G3P1011 at [redacted]w[redacted]d  1. Supervision of other normal pregnancy, antepartum  Patient reports doing well.   2. Low vitamin D level   Continue taking Vitamin D.     3. 4. Maternal varicella, non-immune    Varicella postpartum    Preterm labor symptoms and general obstetric precautions including but not limited to vaginal bleeding, contractions, leaking of fluid and fetal movement were reviewed in detail with the  patient. Please refer to After Visit Summary for other counseling recommendations.  2 wk follow-up  Roe Coombs, CNM

## 2016-08-24 ENCOUNTER — Encounter: Payer: Medicaid Other | Admitting: Certified Nurse Midwife

## 2016-09-11 ENCOUNTER — Ambulatory Visit (HOSPITAL_COMMUNITY)
Admission: RE | Admit: 2016-09-11 | Discharge: 2016-09-11 | Disposition: A | Discharge status: Home / Self Care | Payer: Medicaid Other | Source: Ambulatory Visit | Attending: Certified Nurse Midwife | Admitting: Certified Nurse Midwife

## 2016-09-11 DIAGNOSIS — Z362 Encounter for other antenatal screening follow-up: Secondary | ICD-10-CM | POA: Diagnosis present

## 2016-09-11 DIAGNOSIS — Z3A25 25 weeks gestation of pregnancy: Secondary | ICD-10-CM | POA: Insufficient documentation

## 2016-09-11 DIAGNOSIS — O289 Unspecified abnormal findings on antenatal screening of mother: Secondary | ICD-10-CM | POA: Diagnosis present

## 2016-09-11 DIAGNOSIS — Z3480 Encounter for supervision of other normal pregnancy, unspecified trimester: Secondary | ICD-10-CM

## 2016-09-14 ENCOUNTER — Ambulatory Visit (INDEPENDENT_AMBULATORY_CARE_PROVIDER_SITE_OTHER): Payer: Medicaid Other | Admitting: Certified Nurse Midwife

## 2016-09-14 ENCOUNTER — Encounter: Payer: Self-pay | Admitting: Certified Nurse Midwife

## 2016-09-14 ENCOUNTER — Other Ambulatory Visit: Payer: Self-pay | Admitting: Certified Nurse Midwife

## 2016-09-14 VITALS — BP 114/66 | HR 87 | Wt 187.0 lb

## 2016-09-14 DIAGNOSIS — R7989 Other specified abnormal findings of blood chemistry: Secondary | ICD-10-CM

## 2016-09-14 DIAGNOSIS — Z3480 Encounter for supervision of other normal pregnancy, unspecified trimester: Secondary | ICD-10-CM

## 2016-09-14 DIAGNOSIS — O09292 Supervision of pregnancy with other poor reproductive or obstetric history, second trimester: Secondary | ICD-10-CM

## 2016-09-14 DIAGNOSIS — Z3482 Encounter for supervision of other normal pregnancy, second trimester: Secondary | ICD-10-CM

## 2016-09-14 DIAGNOSIS — Z2839 Other underimmunization status: Secondary | ICD-10-CM

## 2016-09-14 DIAGNOSIS — O09899 Supervision of other high risk pregnancies, unspecified trimester: Secondary | ICD-10-CM

## 2016-09-14 DIAGNOSIS — Z283 Underimmunization status: Secondary | ICD-10-CM

## 2016-09-14 NOTE — Progress Notes (Signed)
   PRENATAL VISIT NOTE  Subjective:  Chapman FitchRoyale D Verner is a 21 y.o. G3P1011 at 573w5d being seen today for ongoing prenatal care.  She is currently monitored for the following issues for this low-risk pregnancy and has Supervision of normal pregnancy, antepartum; Hemoglobin C trait (HCC); Low vitamin D level; Maternal varicella, non-immune; and Hx of preeclampsia, prior pregnancy, currently pregnant, second trimester on her problem list.  Patient reports no complaints.  Contractions: Not present. Vag. Bleeding: None.  Movement: Present. Denies leaking of fluid.   The following portions of the patient's history were reviewed and updated as appropriate: allergies, current medications, past family history, past medical history, past social history, past surgical history and problem list. Problem list updated.  Objective:   Vitals:   09/14/16 0959  BP: 114/66  Pulse: 87  Weight: 187 lb (84.8 kg)    Fetal Status:     Movement: Present   FHT 150, FH,24  General:  Alert, oriented and cooperative. Patient is in no acute distress.  Skin: Skin is warm and dry. No rash noted.   Cardiovascular: Normal heart rate noted  Respiratory: Normal respiratory effort, no problems with respiration noted  Abdomen: Soft, gravid, appropriate for gestational age. Pain/Pressure: Absent     Pelvic:  Cervical exam deferred        Extremities: Normal range of motion.     Mental Status: Normal mood and affect. Normal behavior. Normal judgment and thought content.   Assessment and Plan:  Pregnancy: G3P1011 at 753w5d  1. Supervision of other normal pregnancy, antepartum Patient reports doing well.  2. Low vitamin D level Taking weekly vit d  3. Maternal varicella, non-immune Varicella  4. Hx of preeclampsia, prior pregnancy, currently pregnant, second trimester Bp stable today. No s&s of pre-e  Preterm labor symptoms and general obstetric precautions including but not limited to vaginal bleeding,  contractions, leaking of fluid and fetal movement were reviewed in detail with the patient. Please refer to After Visit Summary for other counseling recommendations.  Return in about 4 weeks (around 10/12/2016) for ROB, 2 hr OGTT.   Elinor ParkinsonShanika K Leland Staszewski, Student-MidWife

## 2016-10-12 ENCOUNTER — Other Ambulatory Visit (HOSPITAL_COMMUNITY)
Admission: RE | Admit: 2016-10-12 | Discharge: 2016-10-12 | Disposition: A | Discharge status: Home / Self Care | Payer: Medicaid Other | Source: Ambulatory Visit | Attending: Certified Nurse Midwife | Admitting: Certified Nurse Midwife

## 2016-10-12 ENCOUNTER — Encounter: Payer: Self-pay | Admitting: Certified Nurse Midwife

## 2016-10-12 ENCOUNTER — Other Ambulatory Visit: Payer: Medicaid Other

## 2016-10-12 ENCOUNTER — Ambulatory Visit (INDEPENDENT_AMBULATORY_CARE_PROVIDER_SITE_OTHER): Payer: Medicaid Other | Admitting: Certified Nurse Midwife

## 2016-10-12 VITALS — BP 106/69 | HR 105 | Wt 192.0 lb

## 2016-10-12 DIAGNOSIS — O09293 Supervision of pregnancy with other poor reproductive or obstetric history, third trimester: Secondary | ICD-10-CM

## 2016-10-12 DIAGNOSIS — M549 Dorsalgia, unspecified: Secondary | ICD-10-CM

## 2016-10-12 DIAGNOSIS — Z3480 Encounter for supervision of other normal pregnancy, unspecified trimester: Secondary | ICD-10-CM | POA: Insufficient documentation

## 2016-10-12 DIAGNOSIS — O09893 Supervision of other high risk pregnancies, third trimester: Secondary | ICD-10-CM

## 2016-10-12 DIAGNOSIS — Z283 Underimmunization status: Secondary | ICD-10-CM

## 2016-10-12 DIAGNOSIS — R51 Headache: Secondary | ICD-10-CM

## 2016-10-12 DIAGNOSIS — R7989 Other specified abnormal findings of blood chemistry: Secondary | ICD-10-CM

## 2016-10-12 DIAGNOSIS — R519 Headache, unspecified: Secondary | ICD-10-CM

## 2016-10-12 DIAGNOSIS — Z2839 Other underimmunization status: Secondary | ICD-10-CM

## 2016-10-12 DIAGNOSIS — O26893 Other specified pregnancy related conditions, third trimester: Secondary | ICD-10-CM

## 2016-10-12 DIAGNOSIS — O09292 Supervision of pregnancy with other poor reproductive or obstetric history, second trimester: Secondary | ICD-10-CM

## 2016-10-12 DIAGNOSIS — O09899 Supervision of other high risk pregnancies, unspecified trimester: Secondary | ICD-10-CM

## 2016-10-12 DIAGNOSIS — O9989 Other specified diseases and conditions complicating pregnancy, childbirth and the puerperium: Secondary | ICD-10-CM

## 2016-10-12 MED ORDER — COMFORT FIT MATERNITY SUPP LG MISC: 1.0000 [IU] | Freq: Every day | 0 refills | Status: DC

## 2016-10-12 MED ORDER — BUTALBITAL-APAP-CAFFEINE 50-325-40 MG PO TABS: 1.0000 | ORAL_TABLET | Freq: Four times a day (QID) | ORAL | 4 refills | Status: DC | PRN

## 2016-10-12 NOTE — Progress Notes (Signed)
Pt states that she is having some back pain, frequent HA's, change in d/c-would like checked today. Pt denies and vaginal itch, irritation or odor.

## 2016-10-12 NOTE — Progress Notes (Signed)
   PRENATAL VISIT NOTE  Subjective:  Ann Farmer is a 21 y.o. G3P1011 at 2556w5d being seen today for ongoing prenatal care.  She is currently monitored for the following issues for this low-risk pregnancy and has Supervision of normal pregnancy, antepartum; Hemoglobin C trait (HCC); Low vitamin D level; Maternal varicella, non-immune; and Hx of preeclampsia, prior pregnancy, currently pregnant, second trimester on her problem list.  Patient reports backache, headache, no bleeding, no contractions, no cramping, no leaking and vaginal irritation.  Contractions: Not present. Vag. Bleeding: None.  Movement: Present. Denies leaking of fluid.   The following portions of the patient's history were reviewed and updated as appropriate: allergies, current medications, past family history, past medical history, past social history, past surgical history and problem list. Problem list updated.  Objective:   Vitals:   10/12/16 0906  BP: 106/69  Pulse: (!) 105  Weight: 192 lb (87.1 kg)    Fetal Status: Fetal Heart Rate (bpm): 148 Fundal Height: 28 cm Movement: Present     General:  Alert, oriented and cooperative. Patient is in no acute distress.  Skin: Skin is warm and dry. No rash noted.   Cardiovascular: Normal heart rate noted  Respiratory: Normal respiratory effort, no problems with respiration noted  Abdomen: Soft, gravid, appropriate for gestational age. Pain/Pressure: Absent     Pelvic:  Cervical exam deferred        Extremities: Normal range of motion.     Mental Status: Normal mood and affect. Normal behavior. Normal judgment and thought content.   Assessment and Plan:  Pregnancy: G3P1011 at 2256w5d  1. Supervision of other normal pregnancy, antepartum      - Glucose Tolerance, 2 Hours w/1 Hour - CBC - HIV antibody - RPR - Cervicovaginal ancillary only  2. Low vitamin D level      Taking weekly vitamin D  3. Maternal varicella, non-immune      Varicella postpartum  4. Hx  of preeclampsia, prior pregnancy, currently pregnant, second trimester     Taking baby ASA  5. Back pain complicating pregnancy, third trimester     Homeopathic comfort measures discussed.  - Elastic Bandages & Supports (COMFORT FIT MATERNITY SUPP LG) MISC; 1 Units by Does not apply route daily.  Dispense: 1 each; Refill: 0  6. Pregnancy headache in third trimester      Has tried tylenol, hx of HA.   - butalbital-acetaminophen-caffeine (FIORICET, ESGIC) 50-325-40 MG tablet; Take 1-2 tablets by mouth every 6 (six) hours as needed.  Dispense: 45 tablet; Refill: 4  Preterm labor symptoms and general obstetric precautions including but not limited to vaginal bleeding, contractions, leaking of fluid and fetal movement were reviewed in detail with the patient. Please refer to After Visit Summary for other counseling recommendations.  Return in about 2 weeks (around 10/26/2016) for ROB.   Roe Coombsachelle A Chayim Bialas, CNM

## 2016-10-13 LAB — CERVICOVAGINAL ANCILLARY ONLY
Bacterial vaginitis: NEGATIVE
CHLAMYDIA, DNA PROBE: NEGATIVE
Candida vaginitis: NEGATIVE
NEISSERIA GONORRHEA: NEGATIVE
Trichomonas: NEGATIVE

## 2016-10-13 LAB — CBC
Hematocrit: 29.7 % — ABNORMAL LOW (ref 34.0–46.6)
Hemoglobin: 10.1 g/dL — ABNORMAL LOW (ref 11.1–15.9)
MCH: 27.7 pg (ref 26.6–33.0)
MCHC: 34 g/dL (ref 31.5–35.7)
MCV: 81 fL (ref 79–97)
PLATELETS: 296 10*3/uL (ref 150–379)
RBC: 3.65 x10E6/uL — AB (ref 3.77–5.28)
RDW: 15.6 % — AB (ref 12.3–15.4)
WBC: 14.9 10*3/uL — ABNORMAL HIGH (ref 3.4–10.8)

## 2016-10-13 LAB — RPR: RPR Ser Ql: NONREACTIVE

## 2016-10-13 LAB — GLUCOSE TOLERANCE, 2 HOURS W/ 1HR
GLUCOSE, 1 HOUR: 105 mg/dL (ref 65–179)
GLUCOSE, 2 HOUR: 84 mg/dL (ref 65–152)
GLUCOSE, FASTING: 75 mg/dL (ref 65–91)

## 2016-10-13 LAB — HIV ANTIBODY (ROUTINE TESTING W REFLEX): HIV SCREEN 4TH GENERATION: NONREACTIVE

## 2016-10-16 ENCOUNTER — Other Ambulatory Visit: Payer: Self-pay | Admitting: Certified Nurse Midwife

## 2016-10-16 DIAGNOSIS — Z3480 Encounter for supervision of other normal pregnancy, unspecified trimester: Secondary | ICD-10-CM

## 2016-10-16 DIAGNOSIS — O99013 Anemia complicating pregnancy, third trimester: Secondary | ICD-10-CM | POA: Insufficient documentation

## 2016-10-16 MED ORDER — CITRANATAL BLOOM 90-1 MG PO TABS: 1.0000 | ORAL_TABLET | Freq: Every day | ORAL | 12 refills | Status: DC

## 2016-10-26 ENCOUNTER — Ambulatory Visit (INDEPENDENT_AMBULATORY_CARE_PROVIDER_SITE_OTHER): Payer: Medicaid Other | Admitting: Certified Nurse Midwife

## 2016-10-26 ENCOUNTER — Encounter: Payer: Self-pay | Admitting: Certified Nurse Midwife

## 2016-10-26 VITALS — BP 121/64 | HR 100 | Wt 198.0 lb

## 2016-10-26 DIAGNOSIS — O09292 Supervision of pregnancy with other poor reproductive or obstetric history, second trimester: Secondary | ICD-10-CM

## 2016-10-26 DIAGNOSIS — O99013 Anemia complicating pregnancy, third trimester: Secondary | ICD-10-CM

## 2016-10-26 DIAGNOSIS — O09293 Supervision of pregnancy with other poor reproductive or obstetric history, third trimester: Secondary | ICD-10-CM

## 2016-10-26 DIAGNOSIS — Z23 Encounter for immunization: Secondary | ICD-10-CM | POA: Diagnosis not present

## 2016-10-26 DIAGNOSIS — Z3480 Encounter for supervision of other normal pregnancy, unspecified trimester: Secondary | ICD-10-CM

## 2016-10-26 DIAGNOSIS — F320 Major depressive disorder, single episode, mild: Secondary | ICD-10-CM

## 2016-10-26 DIAGNOSIS — F32A Depression, unspecified: Secondary | ICD-10-CM

## 2016-10-26 DIAGNOSIS — Z3483 Encounter for supervision of other normal pregnancy, third trimester: Secondary | ICD-10-CM

## 2016-10-26 DIAGNOSIS — Z8659 Personal history of other mental and behavioral disorders: Secondary | ICD-10-CM

## 2016-10-26 DIAGNOSIS — R7989 Other specified abnormal findings of blood chemistry: Secondary | ICD-10-CM

## 2016-10-26 DIAGNOSIS — Z8759 Personal history of other complications of pregnancy, childbirth and the puerperium: Secondary | ICD-10-CM

## 2016-10-26 NOTE — Progress Notes (Signed)
   PRENATAL VISIT NOTE  Subjective:  Ann Farmer is a 21 y.o. G3P1011 at 4454w5d being seen today for ongoing prenatal care.  She is currently monitored for the following issues for this low-risk pregnancy and has Supervision of normal pregnancy, antepartum; Hemoglobin C trait (HCC); Low vitamin D level; Maternal varicella, non-immune; Hx of preeclampsia, prior pregnancy, currently pregnant, second trimester; and Antepartum anemia in third trimester on her problem list.  Patient reports no bleeding, no contractions, no cramping, no leaking and mild depression symptoms, denies homicidal/suicidal thoughts.  Contractions: Not present. Vag. Bleeding: None.  Movement: Present. Denies leaking of fluid.   The following portions of the patient's history were reviewed and updated as appropriate: allergies, current medications, past family history, past medical history, past social history, past surgical history and problem list. Problem list updated.  Objective:   Vitals:   10/26/16 1539  BP: 121/64  Pulse: 100  Weight: 198 lb (89.8 kg)    Fetal Status: Fetal Heart Rate (bpm): 142 Fundal Height: 30 cm Movement: Present     General:  Alert, oriented and cooperative. Patient is in no acute distress.  Skin: Skin is warm and dry. No rash noted.   Cardiovascular: Normal heart rate noted  Respiratory: Normal respiratory effort, no problems with respiration noted  Abdomen: Soft, gravid, appropriate for gestational age. Pain/Pressure: Present     Pelvic:  Cervical exam deferred        Extremities: Normal range of motion.  Edema: Trace  Mental Status: Normal mood and affect. Normal behavior. Normal judgment and thought content.   PHQ 9: score of 5 Mild Assessment and Plan:  Pregnancy: G3P1011 at 4954w5d  1. Low vitamin D level     Taking weekly vitamin D.   2. Supervision of other normal pregnancy, antepartum     Mild depression: counseling offered. Denies suicidal/homicidal thoughts. BH referral  made for counseling.   3. Hx of preeclampsia, prior pregnancy, currently pregnant, second trimester     Taking daily baby asa  4. Antepartum anemia in third trimester     Taking bloom.  Preterm labor symptoms and general obstetric precautions including but not limited to vaginal bleeding, contractions, leaking of fluid and fetal movement were reviewed in detail with the patient. Please refer to After Visit Summary for other counseling recommendations.  Return in about 2 weeks (around 11/09/2016) for ROB.   Roe Coombsachelle A Kenzy Campoverde, CNM

## 2016-11-01 ENCOUNTER — Ambulatory Visit (HOSPITAL_COMMUNITY)
Admission: RE | Admit: 2016-11-01 | Discharge: 2016-11-01 | Disposition: A | Discharge status: Home / Self Care | Payer: Medicaid Other | Source: Ambulatory Visit | Attending: Certified Nurse Midwife | Admitting: Certified Nurse Midwife

## 2016-11-01 DIAGNOSIS — O99113 Other diseases of the blood and blood-forming organs and certain disorders involving the immune mechanism complicating pregnancy, third trimester: Secondary | ICD-10-CM | POA: Insufficient documentation

## 2016-11-01 DIAGNOSIS — O09293 Supervision of pregnancy with other poor reproductive or obstetric history, third trimester: Secondary | ICD-10-CM | POA: Insufficient documentation

## 2016-11-01 DIAGNOSIS — Z3A31 31 weeks gestation of pregnancy: Secondary | ICD-10-CM | POA: Insufficient documentation

## 2016-11-01 DIAGNOSIS — Z3480 Encounter for supervision of other normal pregnancy, unspecified trimester: Secondary | ICD-10-CM

## 2016-11-01 DIAGNOSIS — D582 Other hemoglobinopathies: Secondary | ICD-10-CM | POA: Diagnosis not present

## 2016-11-01 DIAGNOSIS — Z362 Encounter for other antenatal screening follow-up: Secondary | ICD-10-CM | POA: Insufficient documentation

## 2016-11-02 ENCOUNTER — Telehealth: Payer: Self-pay

## 2016-11-02 ENCOUNTER — Other Ambulatory Visit: Payer: Self-pay | Admitting: Certified Nurse Midwife

## 2016-11-02 DIAGNOSIS — Z3480 Encounter for supervision of other normal pregnancy, unspecified trimester: Secondary | ICD-10-CM

## 2016-11-02 NOTE — Telephone Encounter (Signed)
Patient called in wanting to know she should be concerned about the symptoms that she has been having this morning. Pt states that she does not feel good and feels dizzy/faintish, reports being swollen and seeing spots. Pt states that she has had high BP in the past. [facility].

## 2016-11-09 ENCOUNTER — Encounter: Payer: Self-pay | Admitting: Certified Nurse Midwife

## 2016-11-09 ENCOUNTER — Ambulatory Visit (INDEPENDENT_AMBULATORY_CARE_PROVIDER_SITE_OTHER): Payer: Medicaid Other | Admitting: Certified Nurse Midwife

## 2016-11-09 ENCOUNTER — Encounter: Payer: Self-pay | Admitting: *Deleted

## 2016-11-09 VITALS — BP 121/74 | HR 109 | Wt 201.2 lb

## 2016-11-09 DIAGNOSIS — Z2839 Other underimmunization status: Secondary | ICD-10-CM

## 2016-11-09 DIAGNOSIS — R7989 Other specified abnormal findings of blood chemistry: Secondary | ICD-10-CM

## 2016-11-09 DIAGNOSIS — Z3483 Encounter for supervision of other normal pregnancy, third trimester: Secondary | ICD-10-CM

## 2016-11-09 DIAGNOSIS — D649 Anemia, unspecified: Secondary | ICD-10-CM

## 2016-11-09 DIAGNOSIS — O99013 Anemia complicating pregnancy, third trimester: Secondary | ICD-10-CM

## 2016-11-09 DIAGNOSIS — Z3480 Encounter for supervision of other normal pregnancy, unspecified trimester: Secondary | ICD-10-CM

## 2016-11-09 DIAGNOSIS — E559 Vitamin D deficiency, unspecified: Secondary | ICD-10-CM

## 2016-11-09 DIAGNOSIS — O09899 Supervision of other high risk pregnancies, unspecified trimester: Secondary | ICD-10-CM

## 2016-11-09 DIAGNOSIS — Z283 Underimmunization status: Secondary | ICD-10-CM

## 2016-11-09 NOTE — Progress Notes (Signed)
   PRENATAL VISIT NOTE  Subjective:  Ann Farmer is a 21 y.o. G3P1011 at 5968w5d being seen today for ongoing prenatal care.  She is currently monitored for the following issues for this low-risk pregnancy and has Supervision of normal pregnancy, antepartum; Hemoglobin C trait (HCC); Low vitamin D level; Maternal varicella, non-immune; Hx of preeclampsia, prior pregnancy, currently pregnant, second trimester; Antepartum anemia in third trimester; Mild depression (HCC); and History of postpartum depression on her problem list.  Patient reports no bleeding, no leaking and occasional contractions.  Contractions: Not present. Vag. Bleeding: None.  Movement: Present. Denies leaking of fluid.   The following portions of the patient's history were reviewed and updated as appropriate: allergies, current medications, past family history, past medical history, past social history, past surgical history and problem list. Problem list updated.  Objective:   Vitals:   11/09/16 1533  BP: 121/74  Pulse: (!) 109  Weight: 201 lb 3.2 oz (91.3 kg)    Fetal Status: Fetal Heart Rate (bpm): 141 Fundal Height: 32 cm Movement: Present     General:  Alert, oriented and cooperative. Patient is in no acute distress.  Skin: Skin is warm and dry. No rash noted.   Cardiovascular: Normal heart rate noted  Respiratory: Normal respiratory effort, no problems with respiration noted  Abdomen: Soft, gravid, appropriate for gestational age. Pain/Pressure: Present     Pelvic:  Cervical exam deferred        Extremities: Normal range of motion.  Edema: Trace  Mental Status: Normal mood and affect. Normal behavior. Normal judgment and thought content.   Assessment and Plan:  Pregnancy: G3P1011 at 3268w5d  1. Maternal varicella, non-immune     Varicella postpartum   2. Low vitamin D level     Taking weekly vitamin D  3. Supervision of other normal pregnancy, antepartum    Patient reports working 12+ hours of  mandatory overtime per week, is not sleeping well.  States that she is having contractions while at work.  Gets about 2 breaks in 12 hours.       Letter completed for decreased work hours to 8/day and increased breaks at least every 4 hours.  4. Antepartum anemia in third trimester     Taking bloom.   Preterm labor symptoms and general obstetric precautions including but not limited to vaginal bleeding, contractions, leaking of fluid and fetal movement were reviewed in detail with the patient. Please refer to After Visit Summary for other counseling recommendations.  Return in about 2 weeks (around 11/23/2016) for ROB.   Roe Coombsachelle A Toshiye Kever, CNM

## 2016-11-14 ENCOUNTER — Ambulatory Visit (INDEPENDENT_AMBULATORY_CARE_PROVIDER_SITE_OTHER): Payer: Medicaid Other | Admitting: Clinical

## 2016-11-14 DIAGNOSIS — F4323 Adjustment disorder with mixed anxiety and depressed mood: Secondary | ICD-10-CM | POA: Diagnosis not present

## 2016-11-14 NOTE — BH Specialist Note (Signed)
Integrated Behavioral Health Initial Visit  MRN: [id number] Name: Ann Farmer   Session Start time: 10:50 Session End time: 11:50 Total time: 1 hour  Type of Service: Integrated Behavioral Health- Individual/Family Interpretor:No. Interpretor Name and Language: n/a   Warm Hand Off Completed.       SUBJECTIVE: Ann Farmer is a 21 y.o. female accompanied by patient. Patient was referred by Orvilla Cornwallachelle Denney, CNM for depression. Patient reports the following symptoms/concerns: Pt states her primary concern is that she wants to be proactive in preventing postpartum depression after upcoming birth; talking aloud about her feelings, as well as current life stress, will help her cope best. Duration of problem: Over one month; Severity of problem: mild  OBJECTIVE: Mood: Appropriate and Affect: Appropriate Risk of harm to self or others: No plan to harm self or others   LIFE CONTEXT: Family and Social: Lives with husband and 1yo daughter; extended family, including in-laws, all very supportive School/Work: Working fulltime at new job; will be able to have @least  6 weeks short-term disability postpartum. Pt has degree in Healthcare Management. Self-Care: Recognizes need to "learn to relax"; journals and plans ahead for self-care. Life Changes: In past two years, graduated from college, married, moved in with family, gave birth Hyman Bowerto1yo, moved on own, husband hospitalized/out of work temporarily. Will move locally in one month, due with current baby in two months.  GOALS ADDRESSED: Patient will reduce symptoms of: anxiety, depression and stress and increase knowledge and/or ability of: self-management skills and also: Increase healthy adjustment to current life circumstances   INTERVENTIONS: Mindfulness or Management consultantelaxation Training, Psychoeducation and/or Health Education and Link to [employer] [facility]  Standardized Assessments completed: GAD-7 and PHQ 9  ASSESSMENT: Patient currently  experiencing Adjustment disorder with mixed anxious and depressed mood. Patient may benefit from psychoeducation and brief therapeutic interventions regarding coping with symptoms of anxiety and depression.  PLAN: 1. Follow up with behavioral health clinician on : Postpartum, if needed 2. Behavioral recommendations:  -Continue journaling daily for self-care -CALM relaxation breathing exercise after breakfast daily; throughout the day as needed -Implement Worry Hour strategy to prioritize life stressors -Use Postpartum Planner as a guide to prepare for postpartum; share with family, as needed -Read educational material regarding coping with symptoms of anxiety and depression -Attend Mom Talk and Breastfeeding support groups Tuesday mornings at 10am/11am, [facility], postpartum, for additional practical and emotional support 3. Referral(s): Integrated Art gallery managerBehavioral Health Services (In Clinic) and [employer] [facility]:  Mom Talk/Breastfeeding support groups 4. "From scale of 1-10, how likely are you to follow plan?": 9  Jamie C McMannes, LCSWA

## 2016-11-24 ENCOUNTER — Ambulatory Visit (INDEPENDENT_AMBULATORY_CARE_PROVIDER_SITE_OTHER): Payer: Medicaid Other | Admitting: Certified Nurse Midwife

## 2016-11-24 VITALS — BP 124/73 | HR 102 | Wt 203.8 lb

## 2016-11-24 DIAGNOSIS — Z3480 Encounter for supervision of other normal pregnancy, unspecified trimester: Secondary | ICD-10-CM

## 2016-11-24 DIAGNOSIS — O09899 Supervision of other high risk pregnancies, unspecified trimester: Secondary | ICD-10-CM

## 2016-11-24 DIAGNOSIS — Z2839 Other underimmunization status: Secondary | ICD-10-CM

## 2016-11-24 DIAGNOSIS — O99013 Anemia complicating pregnancy, third trimester: Secondary | ICD-10-CM

## 2016-11-24 DIAGNOSIS — F320 Major depressive disorder, single episode, mild: Secondary | ICD-10-CM

## 2016-11-24 DIAGNOSIS — O09293 Supervision of pregnancy with other poor reproductive or obstetric history, third trimester: Secondary | ICD-10-CM

## 2016-11-24 DIAGNOSIS — F32A Depression, unspecified: Secondary | ICD-10-CM

## 2016-11-24 DIAGNOSIS — O09292 Supervision of pregnancy with other poor reproductive or obstetric history, second trimester: Secondary | ICD-10-CM

## 2016-11-24 DIAGNOSIS — O09893 Supervision of other high risk pregnancies, third trimester: Secondary | ICD-10-CM

## 2016-11-24 DIAGNOSIS — R7989 Other specified abnormal findings of blood chemistry: Secondary | ICD-10-CM

## 2016-11-24 DIAGNOSIS — D582 Other hemoglobinopathies: Secondary | ICD-10-CM

## 2016-11-24 DIAGNOSIS — Z283 Underimmunization status: Secondary | ICD-10-CM

## 2016-11-24 NOTE — Progress Notes (Signed)
Patient reports she has a lot of pressure when sitting- especially on the R. Patient is having on/off contractions. She did have a day last week where they were more consistent but they did calm done.

## 2016-11-24 NOTE — Progress Notes (Signed)
   PRENATAL VISIT NOTE  Subjective:  Ann Farmer is a 21 y.o. G3P1011 at 47w6dbeing seen today for ongoing prenatal care.  She is currently monitored for the following issues for this low-risk pregnancy and has Supervision of normal pregnancy, antepartum; Hemoglobin C trait ([place]; Low vitamin D level; Maternal varicella, non-immune; Hx of preeclampsia, prior pregnancy, currently pregnant, second trimester; Antepartum anemia in third trimester; Mild depression ([place]; and History of postpartum depression on her problem list.  Patient reports no bleeding, no contractions, no cramping, no leaking and increased vaginal discharge, no itching or increased pressure.  Contractions: Irregular. Vag. Bleeding: None.  Movement: Present. Denies leaking of fluid.   The following portions of the patient's history were reviewed and updated as appropriate: allergies, current medications, past family history, past medical history, past social history, past surgical history and problem list. Problem list updated.  Objective:   Vitals:   11/24/16 0940  BP: 124/73  Pulse: (!) 102  Weight: 203 lb 12.8 oz (92.4 kg)    Fetal Status: Fetal Heart Rate (bpm): 148 Fundal Height: 35 cm Movement: Present     General:  Alert, oriented and cooperative. Patient is in no acute distress.  Skin: Skin is warm and dry. No rash noted.   Cardiovascular: Normal heart rate noted  Respiratory: Normal respiratory effort, no problems with respiration noted  Abdomen: Soft, gravid, appropriate for gestational age. Pain/Pressure: Absent     Pelvic:  Cervical exam deferred        Extremities: Normal range of motion.  Edema: None  Mental Status: Normal mood and affect. Normal behavior. Normal judgment and thought content.   Assessment and Plan:  Pregnancy: G3P1011 at 374w6d1. Supervision of other normal pregnancy, antepartum     Doing well  2. Hemoglobin C trait ([place]       3. Low vitamin D level     Taking weekly  vitamin D  4. Maternal varicella, non-immune     Varicella postpartum  5. Hx of preeclampsia, prior pregnancy, currently pregnant, second trimester     Taking baby ASA  6. Antepartum anemia in third trimester     Taking bloom  7. Mild depression ([facility]   Has met with counseling.  Monitor for PP depression.   Preterm labor symptoms and general obstetric precautions including but not limited to vaginal bleeding, contractions, leaking of fluid and fetal movement were reviewed in detail with the patient. Please refer to After Visit Summary for other counseling recommendations.  Return in about 1 week (around 12/01/2016) for ROB, GBS.   RaMorene CrockerCNM

## 2016-12-06 ENCOUNTER — Other Ambulatory Visit (HOSPITAL_COMMUNITY)
Admission: RE | Admit: 2016-12-06 | Discharge: 2016-12-06 | Disposition: A | Discharge status: Home / Self Care | Payer: Medicaid Other | Source: Ambulatory Visit | Attending: Certified Nurse Midwife | Admitting: Certified Nurse Midwife

## 2016-12-06 ENCOUNTER — Ambulatory Visit (INDEPENDENT_AMBULATORY_CARE_PROVIDER_SITE_OTHER): Payer: Medicaid Other | Admitting: Certified Nurse Midwife

## 2016-12-06 VITALS — BP 121/71 | HR 109 | Wt 207.5 lb

## 2016-12-06 DIAGNOSIS — Z3483 Encounter for supervision of other normal pregnancy, third trimester: Secondary | ICD-10-CM | POA: Insufficient documentation

## 2016-12-06 DIAGNOSIS — Z2839 Other underimmunization status: Secondary | ICD-10-CM

## 2016-12-06 DIAGNOSIS — Z3A36 36 weeks gestation of pregnancy: Secondary | ICD-10-CM | POA: Diagnosis not present

## 2016-12-06 DIAGNOSIS — R7989 Other specified abnormal findings of blood chemistry: Secondary | ICD-10-CM

## 2016-12-06 DIAGNOSIS — O09899 Supervision of other high risk pregnancies, unspecified trimester: Secondary | ICD-10-CM

## 2016-12-06 DIAGNOSIS — Z3480 Encounter for supervision of other normal pregnancy, unspecified trimester: Secondary | ICD-10-CM

## 2016-12-06 DIAGNOSIS — O99013 Anemia complicating pregnancy, third trimester: Secondary | ICD-10-CM

## 2016-12-06 DIAGNOSIS — Z283 Underimmunization status: Secondary | ICD-10-CM

## 2016-12-06 LAB — OB RESULTS CONSOLE GC/CHLAMYDIA: Gonorrhea: NEGATIVE

## 2016-12-06 LAB — OB RESULTS CONSOLE GBS: STREP GROUP B AG: NEGATIVE

## 2016-12-06 NOTE — Progress Notes (Signed)
   PRENATAL VISIT NOTE  Subjective:  Ann Farmer is a 21 y.o. G3P1011 at 1174w4d being seen today for ongoing prenatal care.  She is currently monitored for the following issues for this low-risk pregnancy and has Supervision of normal pregnancy, antepartum; Hemoglobin C trait (HCC); Low vitamin D level; Maternal varicella, non-immune; Hx of preeclampsia, prior pregnancy, currently pregnant, second trimester; Antepartum anemia in third trimester; Mild depression (HCC); and History of postpartum depression on her problem list.  Patient reports no bleeding, no contractions, no cramping, no leaking and lower leg edema.  Contractions: Irregular. Vag. Bleeding: None.  Movement: Present. Denies leaking of fluid.   The following portions of the patient's history were reviewed and updated as appropriate: allergies, current medications, past family history, past medical history, past social history, past surgical history and problem list. Problem list updated.  Objective:   Vitals:   12/06/16 1557  BP: 121/71  Pulse: (!) 109  Weight: 207 lb 8 oz (94.1 kg)    Fetal Status: Fetal Heart Rate (bpm): 142 Fundal Height: 36 cm Movement: Present  Presentation: Vertex  General:  Alert, oriented and cooperative. Patient is in no acute distress.  Skin: Skin is warm and dry. No rash noted.   Cardiovascular: Normal heart rate noted  Respiratory: Normal respiratory effort, no problems with respiration noted  Abdomen: Soft, gravid, appropriate for gestational age.  Pain/Pressure: Absent     Pelvic: Cervical exam performed Dilation: 1 Effacement (%): 0 Station: -3  Extremities: Normal range of motion.  Edema: Trace  Mental Status:  Normal mood and affect. Normal behavior. Normal judgment and thought content.   Assessment and Plan:  Pregnancy: G3P1011 at 8074w4d  1. Supervision of other normal pregnancy, antepartum      Doing well - Cervicovaginal ancillary only - Strep Gp B NAA  2. Maternal varicella,  non-immune     Varicella postpartum  3. Low vitamin D level      Taking weekly vitamin D  4. Antepartum anemia in third trimester     Taking OTC Iron.   Preterm labor symptoms and general obstetric precautions including but not limited to vaginal bleeding, contractions, leaking of fluid and fetal movement were reviewed in detail with the patient. Please refer to After Visit Summary for other counseling recommendations.  Return in about 1 week (around 12/13/2016) for ROB.   Roe Coombsachelle A Abdulraheem Pineo, CNM

## 2016-12-08 ENCOUNTER — Other Ambulatory Visit: Payer: Self-pay | Admitting: Certified Nurse Midwife

## 2016-12-08 DIAGNOSIS — Z3480 Encounter for supervision of other normal pregnancy, unspecified trimester: Secondary | ICD-10-CM

## 2016-12-08 LAB — CERVICOVAGINAL ANCILLARY ONLY
Bacterial vaginitis: NEGATIVE
Candida vaginitis: NEGATIVE
Chlamydia: NEGATIVE
NEISSERIA GONORRHEA: NEGATIVE
TRICH (WINDOWPATH): NEGATIVE

## 2016-12-08 LAB — STREP GP B NAA: STREP GROUP B AG: NEGATIVE

## 2016-12-13 ENCOUNTER — Inpatient Hospital Stay (HOSPITAL_COMMUNITY)
Admission: AD | Admit: 2016-12-13 | Discharge: 2016-12-13 | Disposition: A | Discharge status: Home / Self Care | Payer: Medicaid Other | Source: Ambulatory Visit | Attending: Family Medicine | Admitting: Family Medicine

## 2016-12-13 ENCOUNTER — Encounter (HOSPITAL_COMMUNITY): Payer: Self-pay | Admitting: *Deleted

## 2016-12-13 ENCOUNTER — Encounter: Payer: Self-pay | Admitting: Certified Nurse Midwife

## 2016-12-13 ENCOUNTER — Ambulatory Visit (INDEPENDENT_AMBULATORY_CARE_PROVIDER_SITE_OTHER): Payer: Medicaid Other | Admitting: Certified Nurse Midwife

## 2016-12-13 ENCOUNTER — Encounter: Payer: Self-pay | Admitting: *Deleted

## 2016-12-13 VITALS — BP 112/65 | HR 124 | Wt 209.6 lb

## 2016-12-13 DIAGNOSIS — Z3480 Encounter for supervision of other normal pregnancy, unspecified trimester: Secondary | ICD-10-CM

## 2016-12-13 DIAGNOSIS — Z3A37 37 weeks gestation of pregnancy: Secondary | ICD-10-CM | POA: Diagnosis not present

## 2016-12-13 DIAGNOSIS — O09293 Supervision of pregnancy with other poor reproductive or obstetric history, third trimester: Secondary | ICD-10-CM

## 2016-12-13 DIAGNOSIS — O26893 Other specified pregnancy related conditions, third trimester: Secondary | ICD-10-CM | POA: Insufficient documentation

## 2016-12-13 DIAGNOSIS — O479 False labor, unspecified: Secondary | ICD-10-CM

## 2016-12-13 DIAGNOSIS — O4703 False labor before 37 completed weeks of gestation, third trimester: Secondary | ICD-10-CM | POA: Diagnosis not present

## 2016-12-13 DIAGNOSIS — O99013 Anemia complicating pregnancy, third trimester: Secondary | ICD-10-CM

## 2016-12-13 DIAGNOSIS — Z3483 Encounter for supervision of other normal pregnancy, third trimester: Secondary | ICD-10-CM

## 2016-12-13 DIAGNOSIS — Z0371 Encounter for suspected problem with amniotic cavity and membrane ruled out: Secondary | ICD-10-CM

## 2016-12-13 DIAGNOSIS — D649 Anemia, unspecified: Secondary | ICD-10-CM

## 2016-12-13 LAB — POCT FERN TEST: POCT Fern Test: NEGATIVE

## 2016-12-13 NOTE — Progress Notes (Signed)
   PRENATAL VISIT NOTE  Subjective:  Ann Farmer is a 21 y.o. G3P1011 at 4926w4d being seen today for ongoing prenatal care.  She is currently monitored for the following issues for this low-risk pregnancy and has Supervision of normal pregnancy, antepartum; Hemoglobin C trait (HCC); Low vitamin D level; Maternal varicella, non-immune; Hx of preeclampsia, prior pregnancy, currently pregnant, third trimester; Antepartum anemia in third trimester; Mild depression (HCC); and History of postpartum depression on her problem list.  Patient reports no bleeding, no leaking and occasional contractions.  Contractions: Irregular.  .  Movement: Present. Denies leaking of fluid.   The following portions of the patient's history were reviewed and updated as appropriate: allergies, current medications, past family history, past medical history, past social history, past surgical history and problem list. Problem list updated.  Objective:   Vitals:   12/13/16 1518  BP: 112/65  Pulse: (!) 124  Weight: 209 lb 9.6 oz (95.1 kg)    Fetal Status: Fetal Heart Rate (bpm): 132 Fundal Height: 37 cm Movement: Present  Presentation: Vertex  General:  Alert, oriented and cooperative. Patient is in no acute distress.  Skin: Skin is warm and dry. No rash noted.   Cardiovascular: Normal heart rate noted  Respiratory: Normal respiratory effort, no problems with respiration noted  Abdomen: Soft, gravid, appropriate for gestational age.  Pain/Pressure: Present     Pelvic: Cervical exam performed Dilation: 1.5 Effacement (%): 20 Station: -3  Extremities: Normal range of motion.  Edema: Trace  Mental Status:  Normal mood and affect. Normal behavior. Normal judgment and thought content.   Assessment and Plan:  Pregnancy: G3P1011 at 4026w4d  1. Supervision of other normal pregnancy, antepartum     Slight change in cervix from last week.  Was seen in MAU overnight for labor symptoms.  States contractions went away and  came back this afternoon, slightly more intense than previously.  Labor s/s reviewed.   Maybe early laboring given cervical change.    2. Hx of preeclampsia, prior pregnancy, currently pregnant, third trimester     Was on baby asa  3. Antepartum anemia in third trimester     Taking OTC iron  Term labor symptoms and general obstetric precautions including but not limited to vaginal bleeding, contractions, leaking of fluid and fetal movement were reviewed in detail with the patient. Please refer to After Visit Summary for other counseling recommendations.  Return in about 1 week (around 12/20/2016) for ROB.   Roe Coombsachelle A Briena Swingler, CNM

## 2016-12-13 NOTE — Progress Notes (Signed)
Patient reports good fetal movement, states that she went to hospital this morning and was sent home, pt reports contractions every 10 minutes and is rating 9 out of 10 for pain, requests cervix to be checked.

## 2016-12-13 NOTE — Discharge Instructions (Signed)

## 2016-12-13 NOTE — MAU Note (Addendum)
Pt reports contractions since 0700 this morning. Pt also reports that she had some fluid leaking down her leg around 0900, but no leaking since that time. + FM. Denies bleeding. Pt reports pain 7/10 w/ UC's

## 2016-12-13 NOTE — MAU Note (Signed)
Contractions started during the night, now every 10 min.  No bleeding. ? Poor story of leaking, small fluid, one time when used restroom, none since.  2nd baby, was 1 cm when last checked.

## 2016-12-13 NOTE — MAU Provider Note (Signed)
S: [name] is a 21 y.o. G3P1011 at 3651w4d  who presents to MAU today complaining of single episode of leaking of fluid. She denies vaginal bleeding. She denies regular contractions. She reports normal fetal movement.    O: BP 123/67   Pulse 91   Temp 99.4 F (37.4 C)   Resp 18   LMP 03/25/2016 (Exact Date)  GENERAL: Well-developed, well-nourished female in no acute distress.  HEAD: Normocephalic, atraumatic.  CHEST: Normal effort of breathing, regular heart rate ABDOMEN: Soft, nontender, gravid PELVIC: Normal external female genitalia. Vagina is pink and rugated. Cervix with normal contour, no lesions. Normal discharge. negative pooling.   Cervical exam:  Dilation: Fingertip Effacement (%): 50 Cervical Position: Posterior Exam by:: L LeftwichCraige Cotta- Kirby CNM   Fetal Monitoring: Baseline: 135 Variability:moderate Accelerations: present Decelerations: none Contractions: irregular, mild to palpation  Results for orders placed or performed during the hospital encounter of 12/13/16 (from the past 24 hour(s))  Fern Test     Status: None   Collection Time: 12/13/16 11:02 AM  Result Value Ref Range   POCT Fern Test Negative = intact amniotic membranes      A: SIUP at 1151w4d  Membranes intact  P: D/C home with labor precautions [facility] Return to MAU as needed for labor or emergencies  Hurshel PartyLeftwich-Kirby, Keyshia Orwick A, CNM 12/13/2016 11:27 AM

## 2016-12-18 DIAGNOSIS — Z3493 Encounter for supervision of normal pregnancy, unspecified, third trimester: Secondary | ICD-10-CM

## 2016-12-20 ENCOUNTER — Inpatient Hospital Stay (HOSPITAL_COMMUNITY): Payer: Medicaid Other | Admitting: Anesthesiology

## 2016-12-20 ENCOUNTER — Inpatient Hospital Stay (HOSPITAL_COMMUNITY)
Admission: AD | Admit: 2016-12-20 | Discharge: 2016-12-22 | DRG: 775 | Disposition: A | Discharge status: Home / Self Care | Payer: Medicaid Other | Source: Ambulatory Visit | Attending: Obstetrics and Gynecology | Admitting: Obstetrics and Gynecology

## 2016-12-20 ENCOUNTER — Encounter: Payer: Self-pay | Admitting: Certified Nurse Midwife

## 2016-12-20 ENCOUNTER — Encounter (HOSPITAL_COMMUNITY): Payer: Self-pay

## 2016-12-20 DIAGNOSIS — Z3490 Encounter for supervision of normal pregnancy, unspecified, unspecified trimester: Secondary | ICD-10-CM

## 2016-12-20 DIAGNOSIS — O4292 Full-term premature rupture of membranes, unspecified as to length of time between rupture and onset of labor: Principal | ICD-10-CM | POA: Diagnosis present

## 2016-12-20 DIAGNOSIS — Z7982 Long term (current) use of aspirin: Secondary | ICD-10-CM

## 2016-12-20 DIAGNOSIS — Z3493 Encounter for supervision of normal pregnancy, unspecified, third trimester: Secondary | ICD-10-CM | POA: Diagnosis present

## 2016-12-20 DIAGNOSIS — O480 Post-term pregnancy: Secondary | ICD-10-CM | POA: Diagnosis not present

## 2016-12-20 DIAGNOSIS — Z3A38 38 weeks gestation of pregnancy: Secondary | ICD-10-CM | POA: Diagnosis not present

## 2016-12-20 HISTORY — DX: Other hemoglobinopathies: D58.2

## 2016-12-20 LAB — CBC
HCT: 29.9 % — ABNORMAL LOW (ref 36.0–46.0)
Hemoglobin: 10.8 g/dL — ABNORMAL LOW (ref 12.0–15.0)
MCH: 28 pg (ref 26.0–34.0)
MCHC: 36.1 g/dL — AB (ref 30.0–36.0)
MCV: 77.5 fL — AB (ref 78.0–100.0)
PLATELETS: 253 10*3/uL (ref 150–400)
RBC: 3.86 MIL/uL — AB (ref 3.87–5.11)
RDW: 15.5 % (ref 11.5–15.5)
WBC: 14.1 10*3/uL — ABNORMAL HIGH (ref 4.0–10.5)

## 2016-12-20 LAB — URINALYSIS, ROUTINE W REFLEX MICROSCOPIC
Bilirubin Urine: NEGATIVE
GLUCOSE, UA: NEGATIVE mg/dL
Hgb urine dipstick: NEGATIVE
Ketones, ur: 80 mg/dL — AB
LEUKOCYTES UA: NEGATIVE
Nitrite: NEGATIVE
PROTEIN: NEGATIVE mg/dL
Specific Gravity, Urine: 1.016 (ref 1.005–1.030)
pH: 6 (ref 5.0–8.0)

## 2016-12-20 LAB — POCT FERN TEST: POCT Fern Test: NEGATIVE

## 2016-12-20 LAB — TYPE AND SCREEN
ABO/RH(D): O POS
Antibody Screen: NEGATIVE

## 2016-12-20 LAB — AMNISURE RUPTURE OF MEMBRANE (ROM) NOT AT ARMC: AMNISURE: POSITIVE

## 2016-12-20 LAB — ABO/RH: ABO/RH(D): O POS

## 2016-12-20 MED ORDER — DIPHENHYDRAMINE HCL 50 MG/ML IJ SOLN: 12.5000 mg | INTRAMUSCULAR | Status: DC | PRN

## 2016-12-20 MED ORDER — OXYTOCIN 40 UNITS IN LACTATED RINGERS INFUSION - SIMPLE MED: 2.5000 [IU]/h | INTRAVENOUS | Status: DC

## 2016-12-20 MED ORDER — EPHEDRINE 5 MG/ML INJ
10.0000 mg | INTRAVENOUS | Status: DC | PRN
  Filled 2016-12-20: qty 2

## 2016-12-20 MED ORDER — OXYTOCIN 40 UNITS IN LACTATED RINGERS INFUSION - SIMPLE MED: 1.0000 m[IU]/min | INTRAVENOUS | Status: DC

## 2016-12-20 MED ORDER — TERBUTALINE SULFATE 1 MG/ML IJ SOLN
0.2500 mg | Freq: Once | INTRAMUSCULAR | Status: DC | PRN
  Filled 2016-12-20: qty 1

## 2016-12-20 MED ORDER — ONDANSETRON HCL 4 MG/2ML IJ SOLN: 4.0000 mg | Freq: Four times a day (QID) | INTRAMUSCULAR | Status: DC | PRN

## 2016-12-20 MED ORDER — PHENYLEPHRINE 40 MCG/ML (10ML) SYRINGE FOR IV PUSH (FOR BLOOD PRESSURE SUPPORT)
80.0000 ug | PREFILLED_SYRINGE | INTRAVENOUS | Status: DC | PRN
  Filled 2016-12-20: qty 5

## 2016-12-20 MED ORDER — FENTANYL CITRATE (PF) 100 MCG/2ML IJ SOLN
100.0000 ug | INTRAMUSCULAR | Status: DC | PRN
  Administered 2016-12-20 (×2): 100 ug via INTRAVENOUS
  Filled 2016-12-20 (×2): qty 2

## 2016-12-20 MED ORDER — OXYTOCIN 40 UNITS IN LACTATED RINGERS INFUSION - SIMPLE MED
1.0000 m[IU]/min | INTRAVENOUS | Status: DC
  Administered 2016-12-20: 1 m[IU]/min via INTRAVENOUS
  Filled 2016-12-20: qty 1000

## 2016-12-20 MED ORDER — ACETAMINOPHEN 325 MG PO TABS: 650.0000 mg | ORAL_TABLET | ORAL | Status: DC | PRN

## 2016-12-20 MED ORDER — FENTANYL 2.5 MCG/ML BUPIVACAINE 1/10 % EPIDURAL INFUSION (WH - ANES)
14.0000 mL/h | INTRAMUSCULAR | Status: DC | PRN
  Administered 2016-12-20: 14 mL/h via EPIDURAL
  Filled 2016-12-20: qty 100

## 2016-12-20 MED ORDER — LACTATED RINGERS IV SOLN
INTRAVENOUS | Status: DC
  Administered 2016-12-20 – 2016-12-21 (×3): via INTRAVENOUS

## 2016-12-20 MED ORDER — OXYCODONE-ACETAMINOPHEN 5-325 MG PO TABS: 1.0000 | ORAL_TABLET | ORAL | Status: DC | PRN

## 2016-12-20 MED ORDER — SOD CITRATE-CITRIC ACID 500-334 MG/5ML PO SOLN: 30.0000 mL | ORAL | Status: DC | PRN

## 2016-12-20 MED ORDER — OXYTOCIN BOLUS FROM INFUSION
500.0000 mL | Freq: Once | INTRAVENOUS | Status: AC
  Administered 2016-12-21: 500 mL via INTRAVENOUS

## 2016-12-20 MED ORDER — LACTATED RINGERS IV SOLN
500.0000 mL | Freq: Once | INTRAVENOUS | Status: AC
  Administered 2016-12-20: 500 mL via INTRAVENOUS

## 2016-12-20 MED ORDER — OXYCODONE-ACETAMINOPHEN 5-325 MG PO TABS: 2.0000 | ORAL_TABLET | ORAL | Status: DC | PRN

## 2016-12-20 MED ORDER — LACTATED RINGERS IV SOLN: 500.0000 mL | INTRAVENOUS | Status: DC | PRN

## 2016-12-20 MED ORDER — PHENYLEPHRINE 40 MCG/ML (10ML) SYRINGE FOR IV PUSH (FOR BLOOD PRESSURE SUPPORT)
80.0000 ug | PREFILLED_SYRINGE | INTRAVENOUS | Status: DC | PRN
  Filled 2016-12-20: qty 10
  Filled 2016-12-20: qty 5

## 2016-12-20 MED ORDER — LACTATED RINGERS IV SOLN: INTRAVENOUS | Status: DC

## 2016-12-20 MED ORDER — LACTATED RINGERS IV SOLN: 500.0000 mL | Freq: Once | INTRAVENOUS | Status: DC

## 2016-12-20 MED ORDER — LIDOCAINE HCL (PF) 1 % IJ SOLN
30.0000 mL | INTRAMUSCULAR | Status: DC | PRN
  Filled 2016-12-20: qty 30

## 2016-12-20 NOTE — Anesthesia Pain Management Evaluation Note (Signed)
  CRNA Pain Management Visit Note  Patient: Ann Farmer, 21 y.o., female  "Hello [facility]. We have an anesthesia team available at all times to provide care throughout the hospital, including epidural management and anesthesia for C-section. I don't know your plan for the delivery whether it a natural birth, water birth, IV sedation, nitrous supplementation, doula or epidural, but we want to meet your pain goals."   1.Was your pain managed to your expectations on prior hospitalizations?   Yes   2.What is your expectation for pain management during this hospitalization?     Epidural  3.How can we help you reach that goal? Epidural when patient reaches her pain goal.  Record the patient's initial score and the patient's pain goal.   Pain: 4  Pain Goal: 7 [facility] wants you to be able to say your pain was always managed very well.  Sharni Negron 12/20/2016

## 2016-12-20 NOTE — Progress Notes (Signed)
Provider notified. Report status of pt given. Orders received to discharge pt home.

## 2016-12-20 NOTE — Progress Notes (Addendum)
G3P1 @ 38.[redacted] wksga. Presents to triage for r/o SROM. Denies bleeding. + FM. EFM applied by another nurse. VSS see flow sheet for details  SVE: 1.5/40/-2 noted lots of vaginal discharge. Intercourse 1 day ago. Fern test done.   1050: variable decel at this time and Provider made aware. Also informed fern test negative.  1100: pt stating that she had a big gush of fluid while at home. Request for amnisure test due to this finding.   1115: amnisure done.   1223: Provider notified. Report status of pt given. Orders received to admit and place admission orders.   1226: Birthing charge nurse notified. Report given. Room assigned to 163.  1244: Labs drawn and IV started.   1300: pt to birthing via wheelchair

## 2016-12-20 NOTE — Progress Notes (Signed)
Patient ID: Ann Farmer, female   DOB: 10/18/1995, 21 y.o.   MRN: [id number]  Foley just out; still comfortable and not feeling ctx  VSS, afeb FHR 130s, +accels, no decels Ctx q 3-4 mins w/ Pit @ 206mu/min Cx 4/50/-3  IUP@term  PROM, now IOL GBS neg  Will increase Pit to achieve adequate labor Anticipate SVD  [name], [name] CNM 12/20/2016 9:44 PM

## 2016-12-20 NOTE — H&P (Addendum)
LABOR ADMISSION HISTORY AND PHYSICAL  Ann Farmer is a 21 y.o. female [phone] with IUP at 5529w4d by LMP presenting for SROM. She reports SROM at 0830 with +amnisure in MAU. Pregnancy has been uncomplicated with routine prenatal visits. She reports +FMs, no VB, no blurry vision, no headaches, no peripheral edema, and no RUQ pain.  She plans on breastfeeding. She requests nexplanon for birth control.  Dating: By LMP--->  Estimated Date of Delivery: 12/30/16  Sono:   @[redacted]w[redacted]d , CWD, normal anatomy, cephalic presentation, 1833g, 45%59% EFW  Prenatal History/Complications:  Past Medical History: Past Medical History:  Diagnosis Date  . Pregnancy induced hypertension    previous preg    Past Surgical History: Past Surgical History:  Procedure Laterality Date  . FRACTURE SURGERY     at 4 years  . HERNIA REPAIR     as an infant    Obstetrical History: OB History    Gravida Para Term Preterm AB Living   3 1 1   1 1    SAB TAB Ectopic Multiple Live Births   1       1      Obstetric Comments   [facility] but after having pain and being evaluated at [facility]- she was told there was no pregnancy.      Social History: Social History   Social History  . Marital status: Married    Spouse name: N/A  . Number of children: N/A  . Years of education: N/A   Social History Main Topics  . Smoking status: Never Smoker  . Smokeless tobacco: Never Used  . Alcohol use No  . Drug use: No  . Sexual activity: Yes    Birth control/ protection: None, Condom   Other Topics Concern  . None   Social History Narrative  . None    Family History: Family History  Problem Relation Age of Onset  . Asthma Other   . Alcohol abuse Neg Hx     Allergies: No Known Allergies  Prescriptions Prior to Admission  Medication Sig Dispense Refill Last Dose  . aspirin 81 MG chewable tablet Chew 1 tablet (81 mg total) by mouth daily. 30 tablet 5 Past Week at Unknown  time  . butalbital-acetaminophen-caffeine (FIORICET, ESGIC) 50-325-40 MG tablet Take 1-2 tablets by mouth every 6 (six) hours as needed. 45 tablet 4 Past Week at Unknown time  . ferrous sulfate 325 (65 FE) MG tablet Take 325 mg by mouth daily with breakfast.   12/19/2016 at Unknown time  . prenatal vitamin w/FE, FA (PRENATAL 1 + 1) 27-1 MG TABS tablet Take 1 tablet by mouth daily at 12 noon.   12/19/2016 at Unknown time  . Vitamin D, Ergocalciferol, (DRISDOL) 50000 units CAPS capsule Take 1 capsule (50,000 Units total) by mouth every 7 (seven) days. (Patient taking differently: Take 50,000 Units by mouth every 7 (seven) days. Mondays) 30 capsule 2 12/18/2016 at Unknown time  . Elastic Bandages & Supports (COMFORT FIT MATERNITY SUPP LG) MISC 1 Units by Does not apply route daily. 1 each 0 Taking     Review of Systems   All systems reviewed and negative except as stated in HPI  Blood pressure 128/72, pulse 97, temperature 98.2 F (36.8 C), temperature source Oral, resp. rate 18, height 5\' 4"  (1.626 m), weight 93 kg (205 lb), last menstrual period 03/25/2016, SpO2 99 %, unknown if currently breastfeeding. General appearance: alert, cooperative and no distress Lungs: normal work of  breathing Extremities: Homans sign is negative, no sign of DVT Presentation: cephalic Fetal monitoringBaseline: 145 bpm, Variability: Good {> 6 bpm), Accelerations: Reactive and Decelerations: Variable Uterine activityFrequency: Every 5-6 minutes Dilation: 1.5 Effacement (%): 40 Station: -3 Exam by:: Naomie Dean RN    Prenatal labs: ABO, Rh: O/Positive/-- (02/22 1656) Antibody: Negative (02/22 1656) Rubella: Immune RPR: Non Reactive (05/31 1100)  HBsAg: Negative (02/22 1656)  HIV:  Non Reactive (05/31 11:55) Varicella: Non Immune GBS: Negative (07/25 1609)  GTT: Fasting- 75, 1 hr- 105, 2 hr- 84  Prenatal Transfer Tool  Maternal Diabetes: No Genetic Screening: Normal Maternal Ultrasounds/Referrals:  Normal Fetal Ultrasounds or other Referrals:  Referred to Materal Fetal Medicine  Maternal Substance Abuse:  No Significant Maternal Medications:  None Significant Maternal Lab Results: None  Results for orders placed or performed during the hospital encounter of 12/20/16 (from the past 24 hour(s))  Fern Test   Collection Time: 12/20/16 10:40 AM  Result Value Ref Range   POCT Fern Test Negative = intact amniotic membranes   Amnisure rupture of membrane (rom)not at [facility]   Collection Time: 12/20/16 11:14 AM  Result Value Ref Range   Amnisure ROM POSITIVE   Urinalysis, Routine w reflex microscopic   Collection Time: 12/20/16 11:20 AM  Result Value Ref Range   Color, Urine YELLOW YELLOW   APPearance CLEAR CLEAR   Specific Gravity, Urine 1.016 1.005 - 1.030   pH 6.0 5.0 - 8.0   Glucose, UA NEGATIVE NEGATIVE mg/dL   Hgb urine dipstick NEGATIVE NEGATIVE   Bilirubin Urine NEGATIVE NEGATIVE   Ketones, ur 80 (A) NEGATIVE mg/dL   Protein, ur NEGATIVE NEGATIVE mg/dL   Nitrite NEGATIVE NEGATIVE   Leukocytes, UA NEGATIVE NEGATIVE    Patient Active Problem List   Diagnosis Date Noted  . Pregnancy 12/20/2016  . 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 History of postpartum depression 10/26/2016  . Antepartum anemia in third trimester 10/16/2016  . Hx of preeclampsia, prior pregnancy, currently pregnant, third trimester 08/17/2016  . 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 Low vitamin D level 07/20/2016  . Maternal varicella, non-immune 07/20/2016  . Supervision of normal pregnancy, antepartum 07/06/2016    Assessment: Ann Farmer is a 21 y.o. G3P1011 at [redacted]w[redacted]d here for SROM.  #Labor: Expected management latent labor, augment per protocol.  #Pain: Epidural upon request.  #FWB: Cat 2 #ID: GBS negative #MOF: Breastfeeding #MOC: Nexplanon #Circ: Outpatient  Dannette Barbara, MS3 12/20/2016, 1:21 PM   Attestation of Attending Supervision of Advanced Practitioner (CNM/NP): Evaluation and  management procedures were performed by the Advanced Practitioner under my supervision and collaboration.  I have reviewed the Advanced Practitioner's note and chart, and I agree with the management and plan.  Tiawana Forgy 12/20/2016 5:50 PM

## 2016-12-20 NOTE — Anesthesia Preprocedure Evaluation (Addendum)
Anesthesia Evaluation  Patient identified by MRN, date of birth, ID band Patient awake    Reviewed: Allergy & Precautions, NPO status , Patient's Chart, lab work & pertinent test results  History of Anesthesia Complications Negative for: history of anesthetic complications  Airway Mallampati: II  TM Distance: >3 FB Neck ROM: Full    Dental  (+) Dental Advisory Given   Pulmonary neg pulmonary ROS,    breath sounds clear to auscultation       Cardiovascular (-) hypertension Rhythm:Regular Rate:Normal     Neuro/Psych Depression negative neurological ROS     GI/Hepatic negative GI ROS, Neg liver ROS,   Endo/Other  negative endocrine ROS  Renal/GU negative Renal ROS     Musculoskeletal   Abdominal   Peds  Hematology Hb 10.8, plt 253k   Anesthesia Other Findings   Reproductive/Obstetrics (+) Pregnancy H/o pre-eclampsia with prior pregnancy                            Anesthesia Physical Anesthesia Plan  ASA: II  Anesthesia Plan: Epidural   Post-op Pain Management:    Induction:   PONV Risk Score and Plan: Treatment may vary due to age or medical condition  Airway Management Planned: Natural Airway  Additional Equipment:   Intra-op Plan:   Post-operative Plan:   Informed Consent: I have reviewed the patients History and Physical, chart, labs and discussed the procedure including the risks, benefits and alternatives for the proposed anesthesia with the patient or authorized representative who has indicated his/her understanding and acceptance.   Dental advisory given  Plan Discussed with:   Anesthesia Plan Comments: (Patient identified. Risks/Benefits/Options discussed with patient including but not limited to bleeding, infection, nerve damage, paralysis, failed block, incomplete pain control, headache, blood pressure changes, nausea, vomiting, reactions to medication both or  allergic, itching and postpartum back pain. Confirmed with bedside nurse the patient's most recent platelet count. Confirmed with patient that they are not currently taking any anticoagulation, have any bleeding history or any family history of bleeding disorders. Patient expressed understanding and wished to proceed. All questions were answered. )       Anesthesia Quick Evaluation

## 2016-12-20 NOTE — MAU Note (Signed)
Pt reports leaking fluid since 0830, contractions.

## 2016-12-20 NOTE — Progress Notes (Signed)
Patient ID: Chapman FitchRoyale D Ancheta, female   DOB: 07/09/1995, 21 y.o.   MRN: [id number] Labor Progress Note  S: Patient seen & examined for progress of labor. Patient comfortable.   O: BP 129/69   Pulse (!) 105   Temp 98.3 F (36.8 C) (Oral)   Resp 18   Ht 5\' 4"  (1.626 m)   Wt 93 kg (205 lb)   LMP 03/25/2016 (Exact Date)   SpO2 99%   BMI 35.19 kg/m   FHT: 150bpm, mod var, +accels, no decels TOCO: q2-64min, patient looks comfortable during contractions  CVE: Dilation: 1.5 Effacement (%): 40 Cervical Position: Posterior Station: -3 Presentation: Vertex Exam by:: Janeen mcclellan RN   A&P: 21 y.o. [id number] 272w4d here for SROM  Foley bulb placed at 1700 Continue current management Anticipate SVD  [place] Mikahla Wisor, DO FM Resident PGY-1 12/20/2016 5:11 PM

## 2016-12-21 ENCOUNTER — Encounter (HOSPITAL_COMMUNITY): Payer: Self-pay

## 2016-12-21 DIAGNOSIS — O480 Post-term pregnancy: Secondary | ICD-10-CM

## 2016-12-21 DIAGNOSIS — Z3A38 38 weeks gestation of pregnancy: Secondary | ICD-10-CM

## 2016-12-21 LAB — RPR: RPR: NONREACTIVE

## 2016-12-21 MED ORDER — PRENATAL MULTIVITAMIN CH
1.0000 | ORAL_TABLET | Freq: Every day | ORAL | Status: DC
  Administered 2016-12-21 – 2016-12-22 (×2): 1 via ORAL
  Filled 2016-12-21 (×2): qty 1

## 2016-12-21 MED ORDER — ONDANSETRON HCL 4 MG PO TABS: 4.0000 mg | ORAL_TABLET | ORAL | Status: DC | PRN

## 2016-12-21 MED ORDER — DIPHENHYDRAMINE HCL 25 MG PO CAPS: 25.0000 mg | ORAL_CAPSULE | Freq: Four times a day (QID) | ORAL | Status: DC | PRN

## 2016-12-21 MED ORDER — IBUPROFEN 600 MG PO TABS
600.0000 mg | ORAL_TABLET | Freq: Four times a day (QID) | ORAL | Status: DC
  Administered 2016-12-21 – 2016-12-22 (×5): 600 mg via ORAL
  Filled 2016-12-21 (×5): qty 1

## 2016-12-21 MED ORDER — SENNOSIDES-DOCUSATE SODIUM 8.6-50 MG PO TABS
2.0000 | ORAL_TABLET | ORAL | Status: DC
  Administered 2016-12-22: 2 via ORAL
  Filled 2016-12-21: qty 2

## 2016-12-21 MED ORDER — LIDOCAINE HCL (PF) 1 % IJ SOLN
INTRAMUSCULAR | Status: DC | PRN
  Administered 2016-12-20 (×2): 6 mL via EPIDURAL

## 2016-12-21 MED ORDER — ONDANSETRON HCL 4 MG/2ML IJ SOLN: 4.0000 mg | INTRAMUSCULAR | Status: DC | PRN

## 2016-12-21 MED ORDER — DIBUCAINE 1 % RE OINT: 1.0000 "application " | TOPICAL_OINTMENT | RECTAL | Status: DC | PRN

## 2016-12-21 MED ORDER — ZOLPIDEM TARTRATE 5 MG PO TABS: 5.0000 mg | ORAL_TABLET | Freq: Every evening | ORAL | Status: DC | PRN

## 2016-12-21 MED ORDER — TETANUS-DIPHTH-ACELL PERTUSSIS 5-2.5-18.5 LF-MCG/0.5 IM SUSP: 0.5000 mL | Freq: Once | INTRAMUSCULAR | Status: DC

## 2016-12-21 MED ORDER — SIMETHICONE 80 MG PO CHEW: 80.0000 mg | CHEWABLE_TABLET | ORAL | Status: DC | PRN

## 2016-12-21 MED ORDER — WITCH HAZEL-GLYCERIN EX PADS: 1.0000 "application " | MEDICATED_PAD | CUTANEOUS | Status: DC | PRN

## 2016-12-21 MED ORDER — COCONUT OIL OIL
1.0000 "application " | TOPICAL_OIL | Status: DC | PRN
  Administered 2016-12-22: 1 via TOPICAL
  Filled 2016-12-21: qty 120

## 2016-12-21 MED ORDER — BENZOCAINE-MENTHOL 20-0.5 % EX AERO: 1.0000 "application " | INHALATION_SPRAY | CUTANEOUS | Status: DC | PRN

## 2016-12-21 MED ORDER — ACETAMINOPHEN 325 MG PO TABS
650.0000 mg | ORAL_TABLET | ORAL | Status: DC | PRN
  Administered 2016-12-21: 650 mg via ORAL
  Filled 2016-12-21: qty 2

## 2016-12-21 NOTE — Lactation Note (Signed)
This note was copied from a baby's chart. Lactation Consultation Note  Patient Name: Ann Farmer ZOXWR'UToday's Date: 12/21/2016 Reason for consult: Initial assessment Baby at 11 hr of life. Upon entry baby was sleeping in the basinet with a thick blanket over his chest and face. Mom was agreeable to lactation moving the blanket and swaddling the baby. Baby was gaging then spit up a large amount of yellow/organge colostrum. Reviewed safe sleep. Mom reports baby is latching well. She denies breast or nipple pain. She is concerned because she had low milk supply with her last baby. Offer hospital grade pump and mom declined, she would rather use her pump from home that is in the car. Discussed baby behavior, feeding frequency, baby belly size, voids, wt loss, breast changes, and nipple care. Mom stated she can manually express and knows how to spoon feed. Given lactation handouts. Aware of OP services and support group.    Maternal Data    Feeding Feeding Type: Breast Fed Length of feed: 30 min  LATCH Score                   Interventions    Lactation Tools Discussed/Used     Consult Status Consult Status: Follow-up Date: 12/22/16 Follow-up type: In-patient    Ann Farmer 12/21/2016, 3:03 PM

## 2016-12-21 NOTE — Plan of Care (Signed)
Problem: Role Relationship: Goal: Ability to demonstrate positive interaction with newborn will improve Outcome: Progressing Patient is bonding well with baby.

## 2016-12-21 NOTE — Anesthesia Postprocedure Evaluation (Signed)
Anesthesia Post Note  Patient: Ann Farmer  Procedure(s) Performed: * No procedures listed *     Patient location during evaluation: Mother Baby Anesthesia Type: Epidural Level of consciousness: awake and alert and oriented Pain management: pain level controlled Vital Signs Assessment: post-procedure vital signs reviewed and stable Respiratory status: spontaneous breathing and nonlabored ventilation Cardiovascular status: stable Postop Assessment: no headache, no signs of nausea or vomiting, no backache, adequate PO intake, epidural receding and patient able to bend at knees Anesthetic complications: no    Last Vitals:  Vitals:   12/21/16 0635 12/21/16 0957  BP: 126/69 133/67  Pulse: 87 83  Resp: 18 18  Temp: 36.9 C 36.7 C  SpO2:  97%    Last Pain:  Vitals:   12/21/16 1213  TempSrc:   PainSc: 4    Pain Goal:                 [place]'Lakes[place]in[place],Ann Farmer

## 2016-12-21 NOTE — Anesthesia Procedure Notes (Signed)
Epidural Patient location during procedure: OB Start time: 12/20/2016 11:40 PM End time: 12/21/2016 12:02 AM  Staffing Anesthesiologist: Jairo BenJACKSON, [place] Performed: anesthesiologist   Preanesthetic Checklist Completed: patient identified, surgical consent, pre-op evaluation, timeout performed, IV checked, risks and benefits discussed and monitors and equipment checked  Epidural Patient position: sitting Prep: site prepped and draped and DuraPrep Patient monitoring: blood pressure, continuous pulse ox and heart rate Approach: midline Location: L2-L3 Injection technique: LOR air  Needle:  Needle type: Tuohy  Needle gauge: 17 G Needle length: 9 cm Needle insertion depth: 6 cm Catheter type: closed end flexible Catheter size: 19 Gauge Catheter at skin depth: 12 cm Test dose: negative (1% lidocaine)  Assessment Events: blood not aspirated, injection not painful, no injection resistance, negative IV test and no paresthesia  Additional Notes Pt identified in Labor room.  Monitors applied. Working IV access confirmed. Sterile prep, drape lumbar spine.  1% lido local L 2,3.  #17ga Touhy LOR air at 6 cm L 2,3, cath in easily to 12 cm skin. Test dose OK, cath dosed and infusion begun.  Patient asymptomatic, VSS, no heme aspirated, tolerated well.  Ann Farmer, MDReason for block:procedure for pain

## 2016-12-22 MED ORDER — IBUPROFEN 600 MG PO TABS: 600.0000 mg | ORAL_TABLET | Freq: Four times a day (QID) | ORAL | 0 refills | Status: DC

## 2016-12-22 NOTE — Discharge Instructions (Signed)
Postpartum Care After Vaginal Delivery °The period of time right after you deliver your newborn is called the postpartum period. °What kind of medical care will I receive? °· You may continue to receive fluids and medicines through an IV tube inserted into one of your veins. °· If an incision was made near your vagina (episiotomy) or if you had some vaginal tearing during delivery, cold compresses may be placed on your episiotomy or your tear. This helps to reduce pain and swelling. °· You may be given a squirt bottle to use when you go to the bathroom. You may use this until you are comfortable wiping as usual. To use the squirt bottle, follow these steps: °? Before you urinate, fill the squirt bottle with warm water. Do not use hot water. °? After you urinate, while you are sitting on the toilet, use the squirt bottle to rinse the area around your urethra and vaginal opening. This rinses away any urine and blood. °? You may do this instead of wiping. As you start healing, you may use the squirt bottle before wiping yourself. Make sure to wipe gently. °? Fill the squirt bottle with clean water every time you use the bathroom. °· You will be given sanitary pads to wear. °How can I expect to feel? °· You may not feel the need to urinate for several hours after delivery. °· You will have some soreness and pain in your abdomen and vagina. °· If you are breastfeeding, you may have uterine contractions every time you breastfeed for up to several weeks postpartum. Uterine contractions help your uterus return to its normal size. °· It is normal to have vaginal bleeding (lochia) after delivery. The amount and appearance of lochia is often similar to a menstrual period in the first week after delivery. It will gradually decrease over the next few weeks to a dry, yellow-brown discharge. For most women, lochia stops completely by 6-8 weeks after delivery. Vaginal bleeding can vary from woman to woman. °· Within the first few  days after delivery, you may have breast engorgement. This is when your breasts feel heavy, full, and uncomfortable. Your breasts may also throb and feel hard, tightly stretched, warm, and tender. After this occurs, you may have milk leaking from your breasts. Your health care provider can help you relieve discomfort due to breast engorgement. Breast engorgement should go away within a few days. °· You may feel more sad or worried than normal due to hormonal changes after delivery. These feelings should not last more than a few days. If these feelings do not go away after several days, speak with your health care provider. °How should I care for myself? °· Tell your health care provider if you have pain or discomfort. °· Drink enough water to keep your urine clear or pale yellow. °· Wash your hands thoroughly with soap and water for at least 20 seconds after changing your sanitary pads, after using the toilet, and before holding or feeding your baby. °· If you are not breastfeeding, avoid touching your breasts a lot. Doing this can make your breasts produce more milk. °· If you become weak or lightheaded, or you feel like you might faint, ask for help before: °? Getting out of bed. °? Showering. °· Change your sanitary pads frequently. Watch for any changes in your flow, such as a sudden increase in volume, a change in color, the passing of large blood clots. If you pass a blood clot from your vagina, save it   to show to your health care provider. Do not flush blood clots down the toilet without having your health care provider look at them. °· Make sure that all your vaccinations are up to date. This can help protect you and your baby from getting certain diseases. You may need to have immunizations done before you leave the hospital. °· If desired, talk with your health care provider about methods of family planning or birth control (contraception). °How can I start bonding with my baby? °Spending as much time as  possible with your baby is very important. During this time, you and your baby can get to know each other and develop a bond. Having your baby stay with you in your room (rooming in) can give you time to get to know your baby. Rooming in can also help you become comfortable caring for your baby. Breastfeeding can also help you bond with your baby. °How can I plan for returning home with my baby? °· Make sure that you have a car seat installed in your vehicle. °? Your car seat should be checked by a certified car seat installer to make sure that it is installed safely. °? Make sure that your baby fits into the car seat safely. °· Ask your health care provider any questions you have about caring for yourself or your baby. Make sure that you are able to contact your health care provider with any questions after leaving the hospital. °This information is not intended to replace advice given to you by your health care provider. Make sure you discuss any questions you have with your health care provider. °Document Released: 02/26/2007 Document Revised: 10/04/2015 Document Reviewed: 04/05/2015 °Elsevier Interactive Patient Education © 2018 Elsevier Inc. ° °

## 2016-12-22 NOTE — Discharge Summary (Signed)
OB Discharge Summary     Patient Name: Ann Farmer DOB: 05/19/1995 MRN: [id number]  Date of admission: 12/20/2016 Delivering MD: [name], KIMBERLY D   Date of discharge: 12/22/2016  Admitting diagnosis: 38WKS,WATER BROKE Intrauterine pregnancy: 4754w5d     Secondary diagnosis:  Active Problems:   Pregnancy  Additional problems: none     Discharge diagnosis: Term Pregnancy Delivered                                                                                                Post partum procedures:none  Augmentation: Pitocin and Foley Balloon  Complications: None  Hospital course:  Onset of Labor With Vaginal Delivery     21 y.o. yo [id number] at 2654w5d was admitted in Latent Labor on 12/20/2016. Patient had an uncomplicated labor course as follows:  Membrane Rupture Time/Date: 8:00 AM ,12/20/2016   Intrapartum Procedures: Episiotomy: None [1]                                         Lacerations:  None [1]  Patient had a delivery of a Viable infant. 12/21/2016  Information for the patient's newborn:  Odis HollingsheadDawkins, Boy Ski [[id number]][[id number]]  Delivery Method: Vaginal, Spontaneous Delivery (Filed from Delivery Summary)    Pateint had an uncomplicated postpartum course.  She is ambulating, tolerating a regular diet, passing flatus, and urinating well. Patient is discharged home in stable condition on 12/22/16.   Physical exam  Vitals:   12/21/16 0635 12/21/16 0957 12/21/16 1830 12/22/16 0555  BP: 126/69 133/67 128/77 122/74  Pulse: 87 83 83 73  Resp: 18 18 18 18   Temp: 98.5 F (36.9 C) 98.1 F (36.7 C) 97.9 F (36.6 C) 97.6 F (36.4 C)  TempSrc: Oral  Oral Oral  SpO2:  97%    Weight:      Height:       General: alert, cooperative and no distress Lochia: appropriate Uterine Fundus: firm Incision: N/A DVT Evaluation: No evidence of DVT seen on physical exam. Negative Homan's sign. No cords or calf tenderness. Labs: Lab Results  Component Value Date   WBC 14.1 (H) 12/20/2016    HGB 10.8 (L) 12/20/2016   HCT 29.9 (L) 12/20/2016   MCV 77.5 (L) 12/20/2016   PLT 253 12/20/2016   No flowsheet data found.  Discharge instruction: per After Visit Summary and "Baby and Me Booklet".  After visit meds:  Allergies as of 12/22/2016   No Known Allergies     Medication List    STOP taking these medications   aspirin 81 MG chewable tablet   butalbital-acetaminophen-caffeine 50-325-40 MG tablet Commonly known as:  FIORICET, ESGIC   COMFORT FIT MATERNITY SUPP LG Misc     TAKE these medications   ferrous sulfate 325 (65 FE) MG tablet Take 325 mg by mouth daily with breakfast.   ibuprofen 600 MG tablet Commonly known as:  ADVIL,MOTRIN Take 1 tablet (600 mg total) by mouth every 6 (six) hours.   prenatal vitamin w/FE, FA  27-1 MG Tabs tablet Take 1 tablet by mouth daily at 12 noon.   Vitamin D (Ergocalciferol) 50000 units Caps capsule Commonly known as:  DRISDOL Take 1 capsule (50,000 Units total) by mouth every 7 (seven) days. What changed:  additional instructions       Diet: routine diet  Activity: Advance as tolerated. Pelvic rest for 6 weeks.   Outpatient follow up: Follow up Appt:Future Appointments Date Time Provider Department Center  01/18/2017 1:00 PM Orvilla Cornwall A, CNM [facility] None     Postpartum contraception: Nexplanon  Newborn Data: Live born female  Birth Weight: 7 lb 10.2 oz (3464 g) APGAR: 9, 9  Baby Feeding: Breast Disposition:home with mother   12/22/2016 Greig Right, CNM

## 2016-12-22 NOTE — Progress Notes (Signed)
CSW received consult for hx of anxiety and depression.  MOB is currently receiving outpatient counseling through MOB's [facility] (next appointment is 12/23/16 )and has met with Integrated Behavioral Health specialist with [facility]. When CSW arrived, MOB was bonding with infant as evidence by breastfeeding.  MOB was polite, insightful, and receptive to meeting with CSW. MOB acknowledged PPD with MOB's first child and attributed to life stressors (graduating from college, recently married, living with in-laws, and unemployed).  CSW provided education regarding Baby Blues vs PMADs and provided MOB with information about support groups held at [facility].  CSW encouraged MOB to evaluate her mental health throughout the postpartum period with the use of the New Mom Checklist developed by Postpartum Progress and notify a medical professional if symptoms arise.    MOB appeared aware and insightful about MOB's MH needs.  MOB denied SI and HI.  There are no barriers to d/c.  Ann Farmer, MSW, LCSW Clinical Social Work [phone]  

## 2016-12-22 NOTE — Lactation Note (Signed)
This note was copied from a baby's chart. Lactation Consultation Note  Patient Name: Ann Farmer ZOXWR'UToday's Date: 12/22/2016 Reason for consult: Follow-up assessment  Baby is 5730 hours old Per MBU RN Wyona AlmasEllen Branstrator the Pedis MD [facility]. AS LC walked in baby hungry, LC reviewed basics and steps for latching.  Per mom mentioned she had been latching in the cradle and football  LC recommended if she was using the cradle to switch to the cross cradle until the  Baby develops stronger muscles in the neck. Explained to mom the importance of obtaining depth  When latching so the baby learns to latch deeply for milk supply.  @ consult LC worked with mom on the cross cradle both breast and depth was achieved, multiple swallows  Noted and the per mom comfortable. 10 mins 1st breast 15 mins second .  LC reviewed sore nipple and engorgement prevention and tx. Mom denies sore nipples.   LC instructed mom on the use hand pump and Provided a #27 flange for when the milk comes in . Per mom has a DEBP at home. ( [employer] ).  Mother informed of post-discharge support and given phone number to the lactation department, including services for phone call assistance; out-patient appointments; and breastfeeding support group. List of other breastfeeding resources in the community given in the handout. Encouraged mother to call for problems or concerns related to breastfeeding.     Maternal Data    Feeding Feeding Type: Breast Fed Length of feed: 15 min (multiple swallows, increased w breast compressions )  LATCH Score Latch:  (Not called to assess LATCH 2300-0700)                 Interventions    Lactation Tools Discussed/Used     Consult Status Consult Status: Follow-up Date: 12/22/16    Kathrin GreathouseMargaret Ann Rutherford Alarie 12/22/2016, 9:53 AM

## 2017-01-01 DIAGNOSIS — Z3480 Encounter for supervision of other normal pregnancy, unspecified trimester: Secondary | ICD-10-CM

## 2017-01-11 ENCOUNTER — Telehealth: Payer: Self-pay

## 2017-01-11 NOTE — Telephone Encounter (Signed)
S/w patient to confirm FMLA dating for paperwork

## 2017-01-12 ENCOUNTER — Other Ambulatory Visit: Payer: Self-pay

## 2017-01-18 ENCOUNTER — Encounter: Payer: Self-pay | Admitting: Certified Nurse Midwife

## 2017-01-18 ENCOUNTER — Ambulatory Visit (INDEPENDENT_AMBULATORY_CARE_PROVIDER_SITE_OTHER): Payer: Medicaid Other | Admitting: Certified Nurse Midwife

## 2017-01-18 ENCOUNTER — Encounter: Payer: Self-pay | Admitting: *Deleted

## 2017-01-18 VITALS — BP 126/78 | HR 97 | Ht 64.0 in | Wt 184.0 lb

## 2017-01-18 DIAGNOSIS — Z3049 Encounter for surveillance of other contraceptives: Secondary | ICD-10-CM | POA: Diagnosis not present

## 2017-01-18 DIAGNOSIS — Z1389 Encounter for screening for other disorder: Secondary | ICD-10-CM

## 2017-01-18 DIAGNOSIS — Z30017 Encounter for initial prescription of implantable subdermal contraceptive: Secondary | ICD-10-CM

## 2017-01-18 MED ORDER — ETONOGESTREL 68 MG ~~LOC~~ IMPL
68.0000 mg | DRUG_IMPLANT | Freq: Once | SUBCUTANEOUS | Status: AC
  Administered 2017-01-18: 68 mg via SUBCUTANEOUS

## 2017-01-18 NOTE — Progress Notes (Signed)
..  Post Partum Exam  Ann Farmer is a 21 y.o. [phone] female who presents for a postpartum visit. She is 4 weeks postpartum following a spontaneous vaginal delivery. I have fully reviewed the prenatal and intrapartum course. The delivery was at 38.4 gestational weeks.  Anesthesia: epidural. Postpartum course has been good. Baby's course has been good. Baby is feeding by breast. Bleeding no bleeding. Bowel function is normal. Bladder function is normal. Patient is not sexually active. Contraception method is none. Postpartum depression screening:neg  The following portions of the patient's history were reviewed and updated as appropriate: allergies, current medications, past family history, past medical history, past social history, past surgical history and problem list.  Review of Systems Pertinent items noted in HPI and remainder of comprehensive ROS otherwise negative.    Objective:  Blood pressure 126/78, pulse 97, height 5\' 4"  (1.626 m), weight 184 lb (83.5 kg), currently breastfeeding.  General:  alert, cooperative and no distress   Breasts:  inspection negative, no nipple discharge or bleeding, no masses or nodularity palpable  Lungs: clear to auscultation bilaterally  Heart:  regular rate and rhythm, S1, S2 normal, no murmur, click, rub or gallop  Abdomen: soft, non-tender; bowel sounds normal; no masses,  no organomegaly  Pelvic Exam: Not performed.       Pap smear: 07/12/16: normal  Assessment:    Normal 4 week postpartum exam. Pap smear not done at today's visit.  Normal recent pap smear.  Plan:   1. Contraception: abstinence and Nexplanon placed today 2. Breast feeding status 3. Follow up in: 4 months or as needed.

## 2017-01-18 NOTE — Progress Notes (Signed)
Nexplanon Procedure Note   PRE-OP DIAGNOSIS: desired long-term, reversible contraception  POST-OP DIAGNOSIS: Same  PROCEDURE: Nexplanon  placement Performing Provider: Kierstynn Babich CNM   Patient education prior to procedure, explained risk, benefits of Nexplanon, reviewed alternative options. Patient reported understanding. Gave consent to continue with procedure.   PROCEDURE:  Pregnancy Text :  Negative Site (check):      left arm         Sterile Preparation:   Betadinex3 Lot # [id number] Expiration Date 06/2019  Insertion site was selected 8 - 10 cm from medial epicondyle and marked along with guiding site using sterile marker. Procedure area was prepped and draped in a sterile fashion. 1% Lidocaine 1.5 ml given prior to procedure. Nexplanon  was inserted subcutaneously.Needle was removed from the insertion site. Nexplanon capsule was palpated by provider and patient to assure satisfactory placement. And a bandage applied and the arm was wrapped with gauze bandage.     Followup: The patient tolerated the procedure well without complications.  Instructions:  The patient was instructed to remove the dressing in 24 hours and that some bruising is to be expected.  She was advised to use over the counter analgesics as needed for any pain at the site.  She is to keep the area dry for 24 hours and to call if her hand or arm becomes cold, numb, or blue.   Tedd Cottrill CNM   

## 2017-02-09 ENCOUNTER — Other Ambulatory Visit: Payer: Self-pay

## 2017-02-09 MED ORDER — NYSTATIN 100000 UNIT/GM EX CREA: 1.0000 "application " | TOPICAL_CREAM | Freq: Two times a day (BID) | CUTANEOUS | 0 refills | Status: DC

## 2017-09-30 ENCOUNTER — Ambulatory Visit: Payer: Self-pay

## 2017-09-30 ENCOUNTER — Ambulatory Visit: Payer: Medicaid Other | Admitting: Family Medicine

## 2017-09-30 VITALS — BP 100/70 | HR 87 | Temp 98.0°F | Resp 16 | Wt 190.8 lb

## 2017-09-30 DIAGNOSIS — J329 Chronic sinusitis, unspecified: Secondary | ICD-10-CM

## 2017-09-30 MED ORDER — AMOXICILLIN-POT CLAVULANATE 875-125 MG PO TABS: 1.0000 | ORAL_TABLET | Freq: Two times a day (BID) | ORAL | 0 refills | Status: DC

## 2017-09-30 MED ORDER — IPRATROPIUM BROMIDE 0.06 % NA SOLN: 1.0000 | Freq: Three times a day (TID) | NASAL | 0 refills | Status: DC

## 2017-09-30 NOTE — Progress Notes (Signed)
Ann Farmer is a 22 y.o. female who presents today with concerns of sinus pressure and pain for many day. She reports that chills, inability to sleep in last 24 hours and increased facial pain and bloody nasal mucus prompted her visit today. She is a mother of 3 small children and reports that her son has an ear infection and URI.  Review of Systems  Constitutional: Positive for chills and malaise/fatigue. Negative for fever.  HENT: Positive for congestion and sinus pain. Negative for ear discharge, ear pain and sore throat.   Eyes: Negative.   Respiratory: Positive for cough and sputum production. Negative for shortness of breath.   Cardiovascular: Negative.  Negative for chest pain.  Gastrointestinal: Negative for abdominal pain, diarrhea, nausea and vomiting.  Genitourinary: Negative for dysuria, frequency, hematuria and urgency.  Musculoskeletal: Negative for myalgias.  Skin: Negative.   Neurological: Negative for headaches.  Endo/Heme/Allergies: Negative.   Psychiatric/Behavioral: Negative.     O: Vitals:   09/30/17 1214  BP: 100/70  Pulse: 87  Resp: 16  Temp: 98 F (36.7 C)  SpO2: 97%     Physical Exam  Constitutional: She is oriented to person, place, and time. Vital signs are normal. She appears well-developed and well-nourished. She is active.  Non-toxic appearance. She does not have a sickly appearance.  HENT:  Head: Normocephalic.  Right Ear: Hearing, external ear and ear canal normal. A middle ear effusion is present.  Left Ear: Hearing, external ear and ear canal normal. A middle ear effusion is present.  Nose: Rhinorrhea present. Right sinus exhibits maxillary sinus tenderness and frontal sinus tenderness. Left sinus exhibits maxillary sinus tenderness and frontal sinus tenderness.  Mouth/Throat: Uvula is midline. Posterior oropharyngeal erythema present.  Neck: Normal range of motion. Neck supple.  Cardiovascular: Normal rate, regular rhythm, normal heart  sounds and normal pulses.  Pulmonary/Chest: Effort normal. She has rales in the right lower field and the left lower field.  Abdominal: Soft. Bowel sounds are normal.  Musculoskeletal: Normal range of motion.  Lymphadenopathy:       Head (right side): No submental and no submandibular adenopathy present.       Head (left side): No submental and no submandibular adenopathy present.    She has no cervical adenopathy.  Neurological: She is alert and oriented to person, place, and time.  Psychiatric: She has a normal mood and affect.  Vitals reviewed.    A: 1. Sinusitis, unspecified chronicity, unspecified location      P: Work note x 48 hours provided. Advised schedule tylenol 650 mg every 6-8 hours for next 24-48 hours.   Exam findings, diagnosis etiology and medication use and indications reviewed with patient. Follow- Up and discharge instructions provided. No emergent/urgent issues found on exam.  Patient verbalized understanding of information provided and agrees with plan of care (POC), all questions answered.  1. Sinusitis, unspecified chronicity, unspecified location - amoxicillin-clavulanate (AUGMENTIN) 875-125 MG tablet; Take 1 tablet by mouth 2 (two) times daily. - ipratropium (ATROVENT) 0.06 % nasal spray; Place 1 spray into both nostrils 3 (three) times daily.

## 2017-09-30 NOTE — Patient Instructions (Signed)

## 2017-10-03 ENCOUNTER — Telehealth: Payer: Self-pay

## 2017-10-03 NOTE — Telephone Encounter (Signed)
I left a message to the patient asking to call us back. 

## 2017-10-11 IMAGING — US US MFM OB FOLLOW-UP
1 series · 14 of 28 positions shown · non-contrast
Comparison: none

[Series 1: us mfm ob follow-up · 14 of 50 slices shown]
[im 2/50]
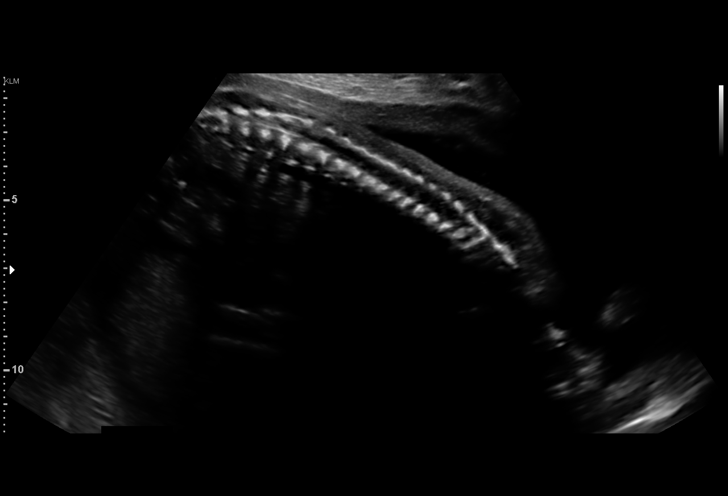
[im 6/50]
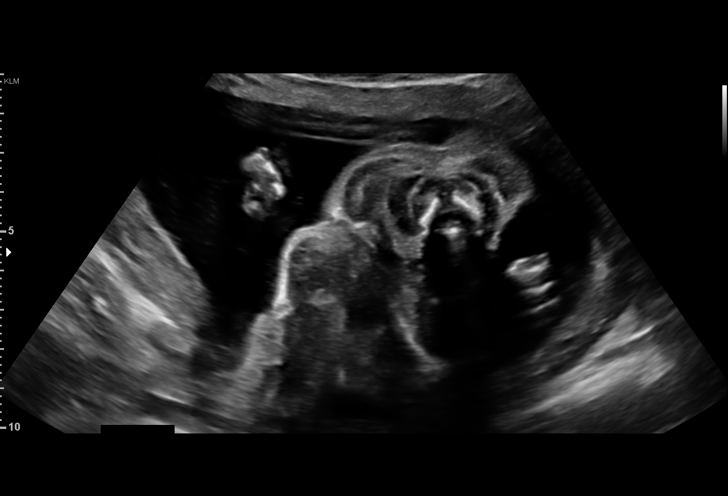
[im 10/50]
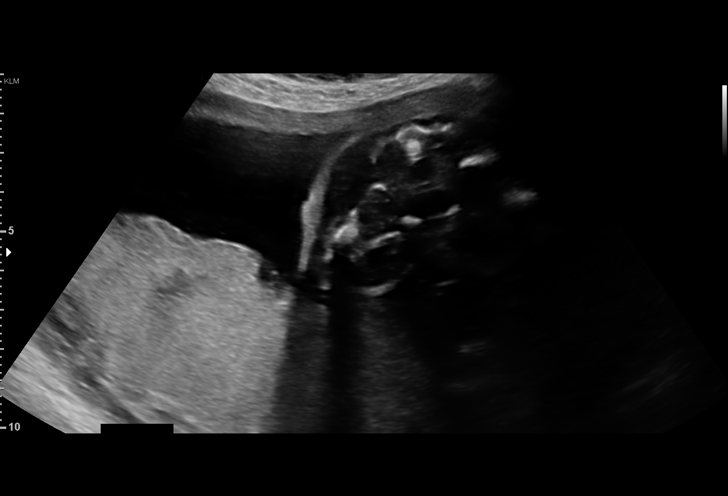
[im 13/50]
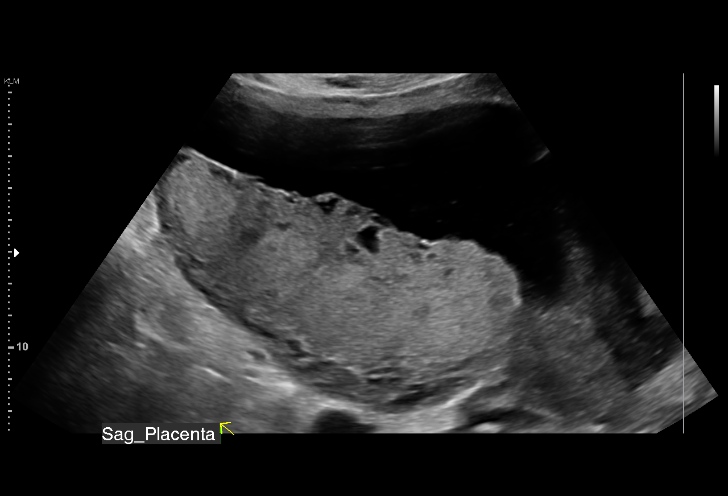
[im 17/50]
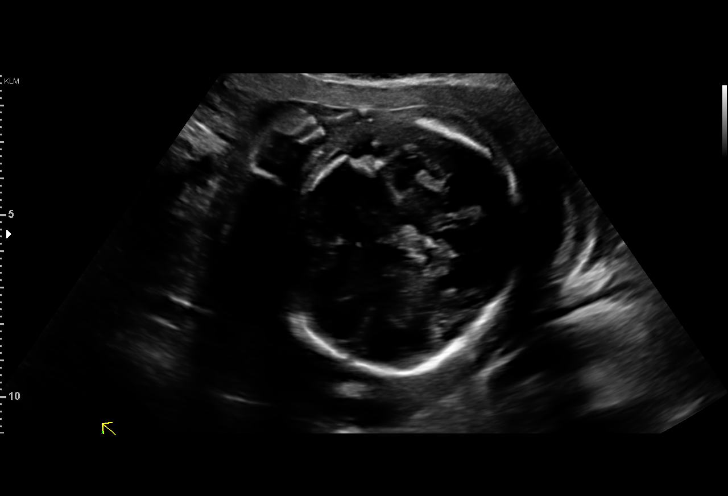
[im 20/50]
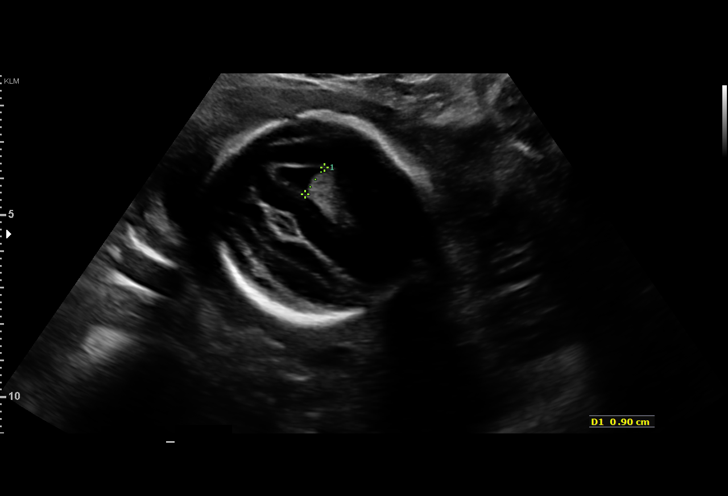
[im 24/50]
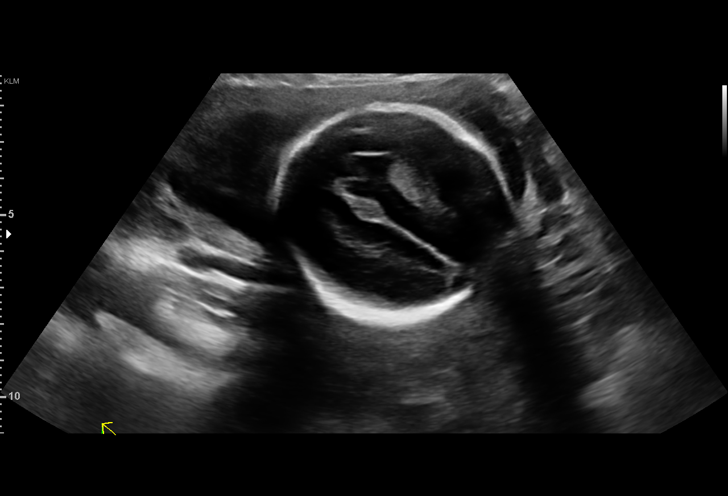
[im 28/50]
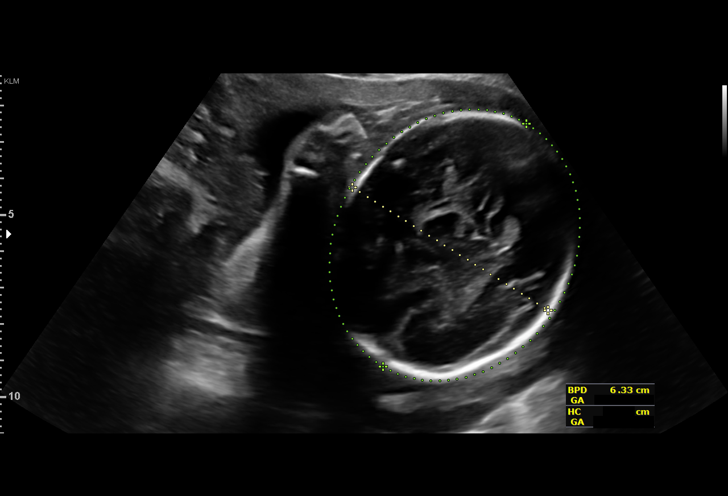
[im 31/50]
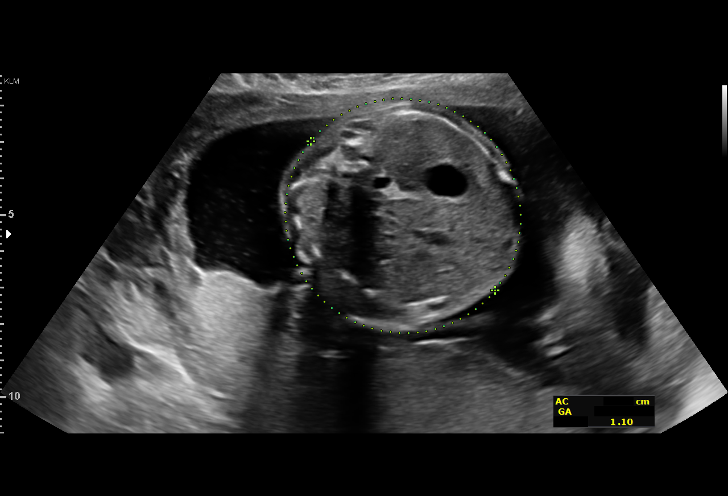
[im 35/50]
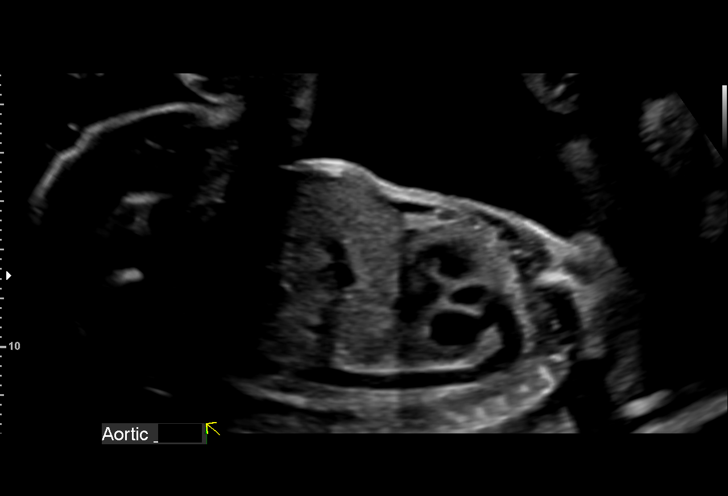
[im 39/50]
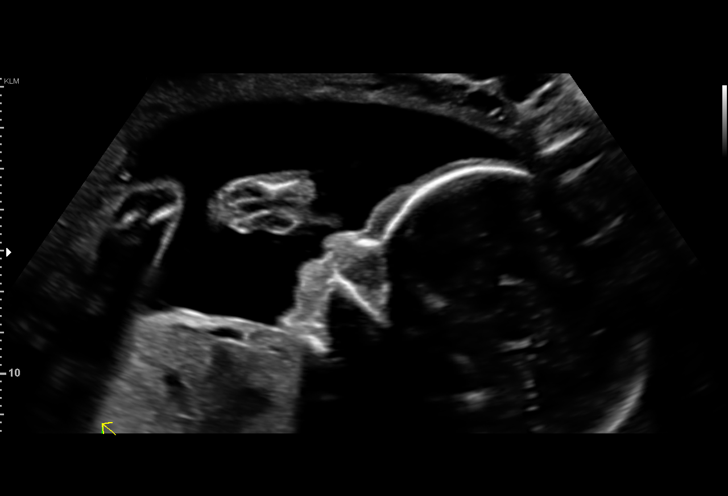
[im 42/50]
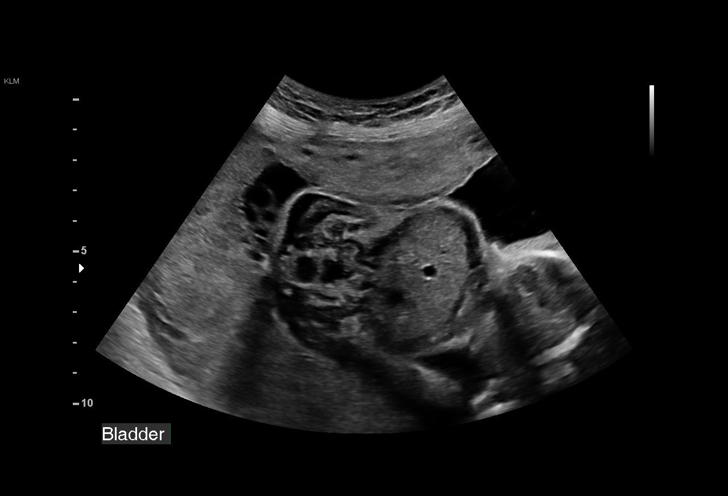
[im 46/50]
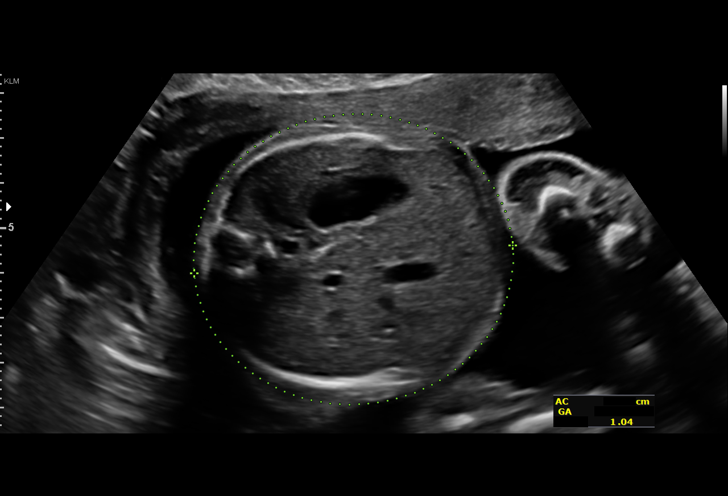
[im 50/50]
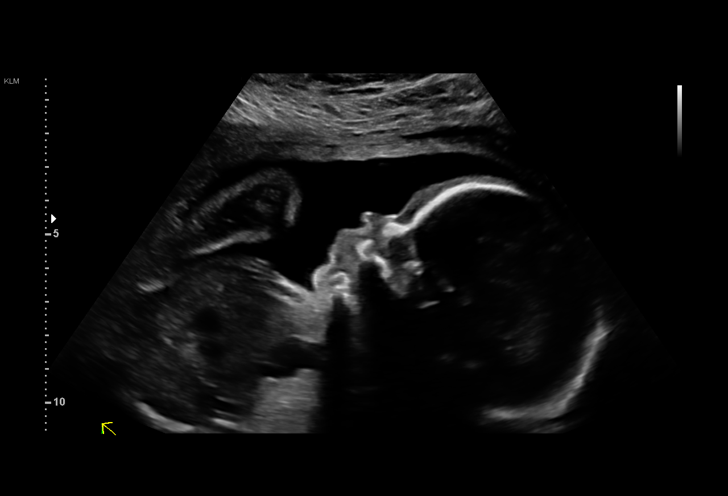

[14 of 28 positions shown; findings below may reference images not displayed]

([HOSPITAL])
[REDACTED] [HOSPITAL]

Indications

24 weeks gestation of pregnancy
Antenatal follow-up for nonvisualized fetal
anatomy
Cerebral ventriculomegaly (Right side
previously seen)
OB History

Blood Type:            Height:  5'4"   Weight (lb):  180       BMI:
Gravidity:    3         Term:   1        Prem:   0        SAB:   1
TOP:          0       Ectopic:  0        Living: 1
Fetal Evaluation

Num Of Fetuses:     1
Fetal Heart         155
Rate(bpm):
Cardiac Activity:   Observed
Presentation:       Cephalic
Placenta:           Posterior, above cervical os
P. Cord Insertion:  Visualized
Amniotic Fluid
AFI FV:      Subjectively within normal limits

Largest Pocket(cm)
5.7
Biometry

BPD:      62.8  mm     G. Age:  25w 3d         82  %    CI:        78.25   %    70 - 86
FL/HC:      19.1   %    18.7 -
HC:      224.6  mm     G. Age:  24w 3d         39  %    HC/AC:      1.09        1.05 -
AC:      205.8  mm     G. Age:  25w 1d         68  %    FL/BPD:     68.5   %    71 - 87
FL:         43  mm     G. Age:  24w 1d         31  %    FL/AC:      20.9   %    20 - 24
Est. FW:     727  gm    1 lb 10 oz      60  %
Gestational Age

LMP:           24w 2d        Date:  03/25/16                 EDD:   12/30/16
U/S Today:     24w 6d                                        EDD:   12/26/16
Best:          24w 2d     Det. By:  LMP  (03/25/16)          EDD:   12/30/16
Anatomy

Cranium:               Appears normal         Aortic Arch:            Appears normal
Cavum:                 Appears normal         Ductal Arch:            Appears normal
Ventricles:            Appears normal         Diaphragm:              Appears normal
Choroid Plexus:        Appears normal         Stomach:                Appears normal, left
sided
Cerebellum:            Appears normal         Abdomen:                Appears normal
Posterior Fossa:       Appears normal         Abdominal Wall:         Previously seen
Nuchal Fold:           Appears normal         Cord Vessels:           Previously seen
Face:                  Orbits and profile     Kidneys:                Appear normal
previously seen
Lips:                  Previously seen        Bladder:                Appears normal
Thoracic:              Appears normal         Spine:                  Appears normal
Heart:                 Appears normal         Upper Extremities:      Previously seen
(4CH, axis, and situs
RVOT:                  Appears normal         Lower Extremities:      Previously seen
LVOT:                  Appears normal

Other:  Fetus appears to be a male. Heels and 5th digit previously visualized.
Nasal bone previously visualized.
Cervix Uterus Adnexa

Cervix
Length:            3.7  cm.
Normal appearance by transabdominal scan.
Impression

Single IUP at 24w 2d
Normal interval anatomy
The previously noted borderline right ventriculomegaly is not
appreciated on today's study
All cranial anatomy appears normal
Fetal growth is appropriate (70th %tile)
Posterior placenta without previa
Normal amniotic fluid volume
Recommendations

Consider follow up ultrasound in 4-6 weeks to reevaluate the
cranial anatomy - otherwise follow up as clinically indicated

## 2018-01-16 NOTE — Congregational Nurse Program (Signed)
Congregational Nurse Program Note  Date of Encounter: 01/16/2018  Past Medical History: Past Medical History:  Diagnosis Date  . Hemoglobin C trait (HCC)   . Pregnancy induced hypertension    previous preg    Encounter Details: CNP Questionnaire - 01/16/18 1946      Questionnaire   Patient Status  Not Applicable    Race  Black or African American    Location Patient Served At  [employer]    Uninsured  Not Applicable    Food  No food insecurities    Housing/Utilities  Yes, have permanent housing    Transportation  No transportation needs    Interpersonal Safety  Yes, feel physically and emotionally safe where you currently live    Medication  No medication insecurities    Medical Provider  Yes    Referrals  Not Applicable    ED Visit Averted  Not Applicable    Life-Saving Intervention Made  Not Applicable      Was very concern about her decisions of what to do about some personal issues. She is very pleased with her spiritual care. And knows she has someone to talk to when she needs it. [address] RN BSN CN [facility] PhD. [phone]

## 2018-02-13 NOTE — Congregational Nurse Program (Signed)
Congregational Nurse Program Note  Date of Encounter: 02/12/2018  Past Medical History: Past Medical History:  Diagnosis Date  . Hemoglobin C trait (HCC)   . Pregnancy induced hypertension    previous preg    Encounter Details: CNP Questionnaire - 02/12/18 1900      Questionnaire   Patient Status  Not Applicable    Race  Black or African American    Location Patient Served At  [employer]    Uninsured  Not Applicable    Food  No food insecurities    Housing/Utilities  Yes, have permanent housing    Transportation  No transportation needs    Interpersonal Safety  Yes, feel physically and emotionally safe where you currently live    Medication  No medication insecurities    Medical Provider  Yes    Referrals  Not Applicable    ED Visit Averted  Not Applicable    Life-Saving Intervention Made  Not Applicable      Spiritual Care , Encouragement, [address] RN BSN CN [facility] PhD, [phone].

## 2020-01-09 ENCOUNTER — Encounter (HOSPITAL_COMMUNITY): Payer: Self-pay

## 2020-05-15 NOTE — L&D Delivery Note (Signed)
Delivery Note At 8:28 AM a viable female was delivered via Vaginal, Spontaneous (Presentation:   Occiput Anterior).  APGAR: 8, 9; weight 3260 gm (7lb 3oz).    FOB called out to desk to alert team that baby delivered.  Patient reported that she did not feel pressure, but then vomited, felt a gush of fluid and then saw that baby had delivered in the bed.  The nursing team and I arrived immediately and placed baby skin to skin with mom.    Placenta status: Spontaneous, Intact.  Cord: 3 vessels with the following complications: None.  Cord pH: n/a  Anesthesia: Epidural Episiotomy: None Lacerations: None Suture Repair:  n/a Est. Blood Loss (mL): 100  Mom to postpartum.  Baby to Couplet care / Skin to Skin.  Roxine Whittinghill GEFFEL Blue Winther 12/13/2020, 8:45 AM

## 2020-05-24 LAB — OB RESULTS CONSOLE GC/CHLAMYDIA
Chlamydia: NEGATIVE
Gonorrhea: NEGATIVE

## 2020-05-24 LAB — OB RESULTS CONSOLE RUBELLA ANTIBODY, IGM: Rubella: IMMUNE

## 2020-05-24 LAB — HEPATITIS C ANTIBODY: HCV Ab: NEGATIVE

## 2020-05-24 LAB — OB RESULTS CONSOLE HEPATITIS B SURFACE ANTIGEN: Hepatitis B Surface Ag: NEGATIVE

## 2020-05-24 LAB — OB RESULTS CONSOLE RPR: RPR: NONREACTIVE

## 2020-05-24 LAB — OB RESULTS CONSOLE HIV ANTIBODY (ROUTINE TESTING): HIV: NONREACTIVE

## 2020-05-25 ENCOUNTER — Other Ambulatory Visit (HOSPITAL_COMMUNITY): Payer: Self-pay | Admitting: Obstetrics and Gynecology

## 2020-05-25 ENCOUNTER — Other Ambulatory Visit: Payer: Self-pay | Admitting: Obstetrics and Gynecology

## 2020-05-25 DIAGNOSIS — O26849 Uterine size-date discrepancy, unspecified trimester: Secondary | ICD-10-CM

## 2020-05-31 ENCOUNTER — Ambulatory Visit: Payer: Self-pay

## 2020-06-04 ENCOUNTER — Ambulatory Visit: Payer: Self-pay

## 2020-06-07 ENCOUNTER — Ambulatory Visit: Admission: RE | Admit: 2020-06-07 | Payer: Self-pay | Source: Ambulatory Visit

## 2020-06-11 ENCOUNTER — Other Ambulatory Visit: Payer: Self-pay

## 2020-06-11 ENCOUNTER — Other Ambulatory Visit: Payer: Self-pay | Admitting: Obstetrics and Gynecology

## 2020-06-11 ENCOUNTER — Ambulatory Visit
Admission: RE | Admit: 2020-06-11 | Discharge: 2020-06-11 | Disposition: A | Discharge status: Home / Self Care | Payer: No Typology Code available for payment source | Source: Ambulatory Visit | Attending: Obstetrics and Gynecology | Admitting: Obstetrics and Gynecology

## 2020-06-11 DIAGNOSIS — O26849 Uterine size-date discrepancy, unspecified trimester: Secondary | ICD-10-CM

## 2020-06-21 ENCOUNTER — Ambulatory Visit: Payer: Self-pay

## 2020-06-28 ENCOUNTER — Other Ambulatory Visit: Payer: Self-pay | Admitting: Obstetrics and Gynecology

## 2020-06-28 DIAGNOSIS — Z363 Encounter for antenatal screening for malformations: Secondary | ICD-10-CM

## 2020-07-27 ENCOUNTER — Other Ambulatory Visit: Payer: Self-pay

## 2020-07-27 ENCOUNTER — Other Ambulatory Visit: Payer: Self-pay | Admitting: Obstetrics and Gynecology

## 2020-07-27 ENCOUNTER — Ambulatory Visit: Payer: No Typology Code available for payment source | Attending: Obstetrics and Gynecology

## 2020-07-27 ENCOUNTER — Other Ambulatory Visit: Payer: Self-pay | Admitting: *Deleted

## 2020-07-27 DIAGNOSIS — Z363 Encounter for antenatal screening for malformations: Secondary | ICD-10-CM

## 2020-07-27 DIAGNOSIS — Z362 Encounter for other antenatal screening follow-up: Secondary | ICD-10-CM

## 2020-08-23 ENCOUNTER — Encounter: Payer: Self-pay | Admitting: *Deleted

## 2020-08-25 ENCOUNTER — Other Ambulatory Visit: Payer: Self-pay

## 2020-08-25 ENCOUNTER — Other Ambulatory Visit: Payer: Self-pay | Admitting: *Deleted

## 2020-08-25 ENCOUNTER — Ambulatory Visit: Payer: PRIVATE HEALTH INSURANCE | Admitting: *Deleted

## 2020-08-25 ENCOUNTER — Ambulatory Visit: Payer: PRIVATE HEALTH INSURANCE | Attending: Obstetrics

## 2020-08-25 ENCOUNTER — Encounter: Payer: Self-pay | Admitting: *Deleted

## 2020-08-25 VITALS — BP 118/61 | HR 79

## 2020-08-25 DIAGNOSIS — Z6832 Body mass index (BMI) 32.0-32.9, adult: Secondary | ICD-10-CM | POA: Diagnosis present

## 2020-08-25 DIAGNOSIS — O321XX0 Maternal care for breech presentation, not applicable or unspecified: Secondary | ICD-10-CM | POA: Diagnosis not present

## 2020-08-25 DIAGNOSIS — O99212 Obesity complicating pregnancy, second trimester: Secondary | ICD-10-CM

## 2020-08-25 DIAGNOSIS — Z3A23 23 weeks gestation of pregnancy: Secondary | ICD-10-CM

## 2020-08-25 DIAGNOSIS — Z8759 Personal history of other complications of pregnancy, childbirth and the puerperium: Secondary | ICD-10-CM

## 2020-08-25 DIAGNOSIS — Z362 Encounter for other antenatal screening follow-up: Secondary | ICD-10-CM

## 2020-08-25 DIAGNOSIS — E669 Obesity, unspecified: Secondary | ICD-10-CM | POA: Diagnosis not present

## 2020-08-25 DIAGNOSIS — Z148 Genetic carrier of other disease: Secondary | ICD-10-CM

## 2020-09-30 LAB — OB RESULTS CONSOLE RPR: RPR: NONREACTIVE

## 2020-10-21 ENCOUNTER — Non-Acute Institutional Stay (HOSPITAL_COMMUNITY)
Admission: RE | Admit: 2020-10-21 | Discharge: 2020-10-21 | Disposition: A | Discharge status: Home / Self Care | Payer: PRIVATE HEALTH INSURANCE | Source: Ambulatory Visit | Attending: Internal Medicine | Admitting: Internal Medicine

## 2020-10-21 ENCOUNTER — Other Ambulatory Visit: Payer: Self-pay | Admitting: Obstetrics and Gynecology

## 2020-10-21 ENCOUNTER — Other Ambulatory Visit: Payer: Self-pay

## 2020-10-21 ENCOUNTER — Other Ambulatory Visit: Payer: Self-pay | Admitting: Family Medicine

## 2020-10-21 DIAGNOSIS — O99019 Anemia complicating pregnancy, unspecified trimester: Secondary | ICD-10-CM | POA: Diagnosis not present

## 2020-10-21 DIAGNOSIS — Z3A00 Weeks of gestation of pregnancy not specified: Secondary | ICD-10-CM | POA: Insufficient documentation

## 2020-10-21 DIAGNOSIS — T7840XA Allergy, unspecified, initial encounter: Secondary | ICD-10-CM | POA: Diagnosis not present

## 2020-10-21 DIAGNOSIS — O9A219 Injury, poisoning and certain other consequences of external causes complicating pregnancy, unspecified trimester: Secondary | ICD-10-CM | POA: Diagnosis not present

## 2020-10-21 DIAGNOSIS — T454X5A Adverse effect of iron and its compounds, initial encounter: Secondary | ICD-10-CM | POA: Insufficient documentation

## 2020-10-21 MED ORDER — SODIUM CHLORIDE 0.9 % IV SOLN
510.0000 mg | Freq: Once | INTRAVENOUS | Status: AC
  Administered 2020-10-21: 510 mg via INTRAVENOUS
  Filled 2020-10-21: qty 510

## 2020-10-21 MED ORDER — SODIUM CHLORIDE 0.9 % IV BOLUS
250.0000 mL | Freq: Once | INTRAVENOUS | Status: AC
  Administered 2020-10-21: 250 mL via INTRAVENOUS

## 2020-10-21 MED ORDER — DIPHENHYDRAMINE HCL 25 MG PO CAPS
25.0000 mg | ORAL_CAPSULE | Freq: Once | ORAL | Status: AC
  Administered 2020-10-21: 25 mg via ORAL
  Filled 2020-10-21: qty 1

## 2020-10-21 MED ORDER — SODIUM CHLORIDE 0.9 % IV SOLN
INTRAVENOUS | Status: DC | PRN
  Administered 2020-10-21: 250 mL via INTRAVENOUS

## 2020-10-21 MED ORDER — SODIUM CHLORIDE 0.9 % IV SOLN
INTRAVENOUS | Status: DC
Start: 2020-10-21 — End: 2020-10-22

## 2020-10-21 NOTE — Progress Notes (Signed)
Patient received IV Feraheme as ordered by Marlow Baars MD. Post infusion, patient developed right upper eye swelling, sneezing and redness. 250 ns bolus and 1 X 25 mg of Benadryl PO was given as ordered by Julianne Handler NP. Doctor's office was informed. Vitals remained stable. Observed for at least 60 minutes post infusion. Patient told to contact her doctor's office for additional information and to go to the ER if the situations worsens. Discharge instructions given, verbalized understanding. Alert, oriented and ambulatory at the time of discharge.

## 2020-10-21 NOTE — Progress Notes (Signed)
Patient received Feraheme infusion per Dr. Laurina Bustle order. After Feraheme infusion, patient experienced mild reaction with right eye swelling, sneezing and runny nose.  Symptoms resolved with Benadryl and fluid bolus. Patient is scheduled to receive second Feraheme infusion in 2 weeks. Called [facility] and spoke with Dr. Ophelia Charter RN.  Requested an order for pre-medications before next infusion.  Per RN, Dr. Chestine Spore is out of the office today but request would be sent to the physician on-call.

## 2020-10-21 NOTE — Progress Notes (Signed)
Ann Farmer is a 25 year female in 3rd trimester of pregnancy  presents to day infusion center for IV Iron infusion (ferriheme). Following infusion, patient developed right periorbital edema and sneezing. She denies throat swelling, dizziness, shortness of breath and heart palpitations. Current vital signs are unremarkable. Infusion completed, will initiate a normal saline bolus and Benadryl 25 mg x 1.   Patient is alert, oriented, and ambulating without assistance. She is to follow up with OB first available appointment. Patient is schedule to receive additional dose of Ferriheme in 1 week. Will defer to [facility] for possible prophylactic medications prior to infusion.    Nolon Nations  APRN, MSN, FNP-C Patient Care [facility] [address] [place], [place] [address] [phone]

## 2020-10-27 ENCOUNTER — Other Ambulatory Visit: Payer: Self-pay

## 2020-10-27 ENCOUNTER — Ambulatory Visit: Payer: No Typology Code available for payment source | Attending: Obstetrics

## 2020-10-27 ENCOUNTER — Ambulatory Visit: Payer: No Typology Code available for payment source | Admitting: *Deleted

## 2020-10-27 ENCOUNTER — Encounter: Payer: Self-pay | Admitting: *Deleted

## 2020-10-27 VITALS — BP 119/65 | HR 96

## 2020-10-27 DIAGNOSIS — Z148 Genetic carrier of other disease: Secondary | ICD-10-CM

## 2020-10-27 DIAGNOSIS — Z362 Encounter for other antenatal screening follow-up: Secondary | ICD-10-CM

## 2020-10-27 DIAGNOSIS — E669 Obesity, unspecified: Secondary | ICD-10-CM | POA: Diagnosis not present

## 2020-10-27 DIAGNOSIS — Z3A32 32 weeks gestation of pregnancy: Secondary | ICD-10-CM

## 2020-10-27 DIAGNOSIS — Z6832 Body mass index (BMI) 32.0-32.9, adult: Secondary | ICD-10-CM

## 2020-10-27 DIAGNOSIS — Z8759 Personal history of other complications of pregnancy, childbirth and the puerperium: Secondary | ICD-10-CM

## 2020-10-27 DIAGNOSIS — O99212 Obesity complicating pregnancy, second trimester: Secondary | ICD-10-CM | POA: Diagnosis not present

## 2020-11-04 ENCOUNTER — Encounter (HOSPITAL_COMMUNITY): Payer: No Typology Code available for payment source

## 2020-11-11 ENCOUNTER — Telehealth: Payer: Self-pay

## 2020-11-11 NOTE — Telephone Encounter (Signed)
error 

## 2020-11-24 LAB — OB RESULTS CONSOLE GBS: GBS: NEGATIVE

## 2020-12-10 ENCOUNTER — Encounter (HOSPITAL_COMMUNITY): Payer: Self-pay | Admitting: Obstetrics & Gynecology

## 2020-12-10 ENCOUNTER — Inpatient Hospital Stay (EMERGENCY_DEPARTMENT_HOSPITAL)
Admission: EM | Admit: 2020-12-10 | Discharge: 2020-12-10 | Disposition: A | Discharge status: Home / Self Care | Payer: No Typology Code available for payment source | Source: Home / Self Care | Admitting: Obstetrics & Gynecology

## 2020-12-10 ENCOUNTER — Other Ambulatory Visit: Payer: Self-pay

## 2020-12-10 DIAGNOSIS — Z0371 Encounter for suspected problem with amniotic cavity and membrane ruled out: Secondary | ICD-10-CM | POA: Diagnosis not present

## 2020-12-10 DIAGNOSIS — Z3A38 38 weeks gestation of pregnancy: Secondary | ICD-10-CM | POA: Diagnosis not present

## 2020-12-10 DIAGNOSIS — O471 False labor at or after 37 completed weeks of gestation: Secondary | ICD-10-CM | POA: Insufficient documentation

## 2020-12-10 LAB — POCT FERN TEST: POCT Fern Test: NEGATIVE

## 2020-12-10 NOTE — Discharge Instructions (Signed)

## 2020-12-10 NOTE — MAU Note (Signed)
.  Ann Farmer is a 25 y.o. at [redacted]w[redacted]d here in MAU reporting: SROM at 1230 this afternoon, clear fluid. Also reports bloody show. Endorses good fetal movement. Reporting ctx every 15 minutes.   Pain score: 6 Vitals:   12/10/20 1612  BP: 123/67  Pulse: (!) 108  Resp: 16  Temp: 98.6 F (37 C)     FHT:145

## 2020-12-10 NOTE — MAU Provider Note (Signed)
S: Ms. DE LIBMAN is a 25 y.o. [phone] at [redacted]w[redacted]d  who presents to MAU today complaining of leaking of fluid since noon. She denies vaginal bleeding. She denies contractions. She reports normal fetal movement.    O: BP 123/67 (BP Location: Right Arm)   Pulse (!) 108   Temp 98.6 F (37 C) (Oral)   Resp 16   LMP 03/15/2020  GENERAL: Well-developed, well-nourished female in no acute distress.  HEAD: Normocephalic, atraumatic.  CHEST: Normal effort of breathing, regular heart rate ABDOMEN: Soft, nontender, gravid PELVIC: Normal external female genitalia. Vagina is pink and rugated. Cervix with normal contour, no lesions. Normal discharge.  No pooling.   Cervical exam:   2.5/thick/posterior   Fetal Monitoring: Baseline: 140 Variability: moderate Accelerations: 15x15 Decelerations: none Contractions: occasional uc's  Fern: negative  A: SIUP at [redacted]w[redacted]d  Membranes intact  P: 1. Encounter for suspected premature rupture of amniotic membranes, with rupture of membranes not found   2. [redacted] weeks gestation of pregnancy    -Discharge home in stable condition -Labor precautions discussed -Patient advised to follow-up with OB as scheduled for prenatal care -Patient may return to MAU as needed or if her condition were to change or worsen   Rolm Bookbinder, [place] 12/10/2020 5:03 PM

## 2020-12-12 ENCOUNTER — Encounter (HOSPITAL_COMMUNITY): Payer: Self-pay | Admitting: Obstetrics & Gynecology

## 2020-12-12 ENCOUNTER — Inpatient Hospital Stay (HOSPITAL_COMMUNITY)
Admission: AD | Admit: 2020-12-12 | Discharge: 2020-12-14 | DRG: 807 | Disposition: A | Discharge status: Home / Self Care | Payer: No Typology Code available for payment source | Attending: Obstetrics | Admitting: Obstetrics

## 2020-12-12 DIAGNOSIS — Z3A39 39 weeks gestation of pregnancy: Secondary | ICD-10-CM

## 2020-12-12 DIAGNOSIS — O9902 Anemia complicating childbirth: Principal | ICD-10-CM | POA: Diagnosis present

## 2020-12-12 DIAGNOSIS — Z20822 Contact with and (suspected) exposure to covid-19: Secondary | ICD-10-CM | POA: Diagnosis present

## 2020-12-12 DIAGNOSIS — O99214 Obesity complicating childbirth: Secondary | ICD-10-CM | POA: Diagnosis present

## 2020-12-12 LAB — AMNISURE RUPTURE OF MEMBRANE (ROM) NOT AT ARMC: Amnisure ROM: NEGATIVE

## 2020-12-12 LAB — POCT FERN TEST: POCT Fern Test: NEGATIVE

## 2020-12-12 NOTE — MAU Provider Note (Signed)
Event Date/Time   First Provider Initiated Contact with Patient 12/12/20 2300     S: [name] is a 25 y.o. [phone] at [redacted]w[redacted]d  who presents to MAU today complaining contractions q 5 minutes since 1830 She denies vaginal bleeding. She denies LOF. She reports decreased fetal movement over the past two hours. She denies DM, HTN. She has no c/section in the past. She has not eaten since 7 pm. She denies any other complaints or complications in this pregnancy.    O: BP 135/84   Pulse 89   Temp 98 F (36.7 C) (Oral)   Resp 17   Ht 5\' 4"  (1.626 m)   Wt 94 kg   LMP 03/15/2020   SpO2 99%   BMI 35.58 kg/m  GENERAL: Well-developed, well-nourished female in no acute distress.  HEAD: Normocephalic, atraumatic.  CHEST: Normal effort of breathing, regular heart rate ABDOMEN: Soft, nontender, gravid  Cervical exam:  Dilation: 4.5 Effacement (%): 70, 80 Cervical Position: Anterior Station: -2 Presentation: Vertex Exam by:: Dr. [email or web address]   Fetal Monitoring: Baseline: 165 Variability: mod Accelerations: present Decelerations: absent Contractions: q4-5   A: SIUP at [redacted]w[redacted]d  Active labor  P: Admit Patient now with reactive NST and strong fetal movements. However, fetal heart rate in the 160-170s, PO hydration given.  Will admit since she has made cervical change.   [redacted]w[redacted]d, CNM 12/13/2020 5:51 AM

## 2020-12-12 NOTE — MAU Note (Signed)
Pt reports to MAU with c/o contractions every 5 minutes or less over the past two hours.  Pt states that she was the pool earlier and felt fluid leaking.  Pt denies leaking of fluid currently.  Pt also reports DFM.

## 2020-12-13 ENCOUNTER — Inpatient Hospital Stay (HOSPITAL_COMMUNITY): Payer: No Typology Code available for payment source | Admitting: Anesthesiology

## 2020-12-13 ENCOUNTER — Encounter (HOSPITAL_COMMUNITY): Payer: Self-pay | Admitting: Obstetrics & Gynecology

## 2020-12-13 ENCOUNTER — Other Ambulatory Visit: Payer: Self-pay

## 2020-12-13 DIAGNOSIS — O26893 Other specified pregnancy related conditions, third trimester: Secondary | ICD-10-CM | POA: Diagnosis present

## 2020-12-13 DIAGNOSIS — Z3A39 39 weeks gestation of pregnancy: Secondary | ICD-10-CM | POA: Diagnosis not present

## 2020-12-13 DIAGNOSIS — O99214 Obesity complicating childbirth: Secondary | ICD-10-CM | POA: Diagnosis present

## 2020-12-13 DIAGNOSIS — Z20822 Contact with and (suspected) exposure to covid-19: Secondary | ICD-10-CM | POA: Diagnosis present

## 2020-12-13 DIAGNOSIS — O9902 Anemia complicating childbirth: Secondary | ICD-10-CM | POA: Diagnosis present

## 2020-12-13 LAB — TYPE AND SCREEN
ABO/RH(D): O POS
Antibody Screen: NEGATIVE

## 2020-12-13 LAB — RPR: RPR Ser Ql: NONREACTIVE

## 2020-12-13 LAB — CBC
HCT: 27.8 % — ABNORMAL LOW (ref 36.0–46.0)
Hemoglobin: 9.6 g/dL — ABNORMAL LOW (ref 12.0–15.0)
MCH: 27.4 pg (ref 26.0–34.0)
MCHC: 34.5 g/dL (ref 30.0–36.0)
MCV: 79.4 fL — ABNORMAL LOW (ref 80.0–100.0)
Platelets: 269 10*3/uL (ref 150–400)
RBC: 3.5 MIL/uL — ABNORMAL LOW (ref 3.87–5.11)
RDW: 15.5 % (ref 11.5–15.5)
WBC: 11.5 10*3/uL — ABNORMAL HIGH (ref 4.0–10.5)
nRBC: 0 % (ref 0.0–0.2)

## 2020-12-13 LAB — RESP PANEL BY RT-PCR (FLU A&B, COVID) ARPGX2
Influenza A by PCR: NEGATIVE
Influenza B by PCR: NEGATIVE
SARS Coronavirus 2 by RT PCR: NEGATIVE

## 2020-12-13 MED ORDER — OXYTOCIN-SODIUM CHLORIDE 30-0.9 UT/500ML-% IV SOLN: 2.5000 [IU]/h | INTRAVENOUS | Status: DC

## 2020-12-13 MED ORDER — OXYCODONE-ACETAMINOPHEN 5-325 MG PO TABS: 1.0000 | ORAL_TABLET | ORAL | Status: DC | PRN

## 2020-12-13 MED ORDER — PHENYLEPHRINE 40 MCG/ML (10ML) SYRINGE FOR IV PUSH (FOR BLOOD PRESSURE SUPPORT)
PREFILLED_SYRINGE | INTRAVENOUS | Status: AC
  Filled 2020-12-13: qty 10

## 2020-12-13 MED ORDER — OXYCODONE HCL 5 MG PO TABS: 10.0000 mg | ORAL_TABLET | ORAL | Status: DC | PRN

## 2020-12-13 MED ORDER — FENTANYL CITRATE (PF) 100 MCG/2ML IJ SOLN
50.0000 ug | INTRAMUSCULAR | Status: DC | PRN
  Administered 2020-12-13: 100 ug via INTRAVENOUS
  Filled 2020-12-13: qty 2

## 2020-12-13 MED ORDER — SOD CITRATE-CITRIC ACID 500-334 MG/5ML PO SOLN: 30.0000 mL | ORAL | Status: DC | PRN

## 2020-12-13 MED ORDER — LIDOCAINE HCL (PF) 1 % IJ SOLN: 30.0000 mL | INTRAMUSCULAR | Status: DC | PRN

## 2020-12-13 MED ORDER — PHENYLEPHRINE 40 MCG/ML (10ML) SYRINGE FOR IV PUSH (FOR BLOOD PRESSURE SUPPORT): 80.0000 ug | PREFILLED_SYRINGE | INTRAVENOUS | Status: DC | PRN

## 2020-12-13 MED ORDER — FENTANYL-BUPIVACAINE-NACL 0.5-0.125-0.9 MG/250ML-% EP SOLN
12.0000 mL/h | EPIDURAL | Status: DC | PRN
  Administered 2020-12-13: 12 mL/h via EPIDURAL

## 2020-12-13 MED ORDER — OXYCODONE HCL 5 MG PO TABS: 5.0000 mg | ORAL_TABLET | ORAL | Status: DC | PRN

## 2020-12-13 MED ORDER — SENNOSIDES-DOCUSATE SODIUM 8.6-50 MG PO TABS
2.0000 | ORAL_TABLET | ORAL | Status: DC
  Administered 2020-12-13 – 2020-12-14 (×2): 2 via ORAL
  Filled 2020-12-13 (×2): qty 2

## 2020-12-13 MED ORDER — LIDOCAINE HCL (PF) 1 % IJ SOLN
INTRAMUSCULAR | Status: DC | PRN
  Administered 2020-12-13: 11 mL via EPIDURAL

## 2020-12-13 MED ORDER — ONDANSETRON HCL 4 MG/2ML IJ SOLN: 4.0000 mg | Freq: Four times a day (QID) | INTRAMUSCULAR | Status: DC | PRN

## 2020-12-13 MED ORDER — ACETAMINOPHEN 325 MG PO TABS: 650.0000 mg | ORAL_TABLET | ORAL | Status: DC | PRN

## 2020-12-13 MED ORDER — IBUPROFEN 600 MG PO TABS
600.0000 mg | ORAL_TABLET | Freq: Four times a day (QID) | ORAL | Status: DC
  Administered 2020-12-13 – 2020-12-14 (×5): 600 mg via ORAL
  Filled 2020-12-13 (×5): qty 1

## 2020-12-13 MED ORDER — EPHEDRINE 5 MG/ML INJ: 10.0000 mg | INTRAVENOUS | Status: DC | PRN

## 2020-12-13 MED ORDER — LACTATED RINGERS IV SOLN: 500.0000 mL | Freq: Once | INTRAVENOUS | Status: DC

## 2020-12-13 MED ORDER — TETANUS-DIPHTH-ACELL PERTUSSIS 5-2.5-18.5 LF-MCG/0.5 IM SUSY: 0.5000 mL | PREFILLED_SYRINGE | Freq: Once | INTRAMUSCULAR | Status: DC

## 2020-12-13 MED ORDER — COCONUT OIL OIL: 1.0000 "application " | TOPICAL_OIL | Status: DC | PRN

## 2020-12-13 MED ORDER — LACTATED RINGERS IV SOLN
500.0000 mL | INTRAVENOUS | Status: DC | PRN
  Administered 2020-12-13: 1000 mL via INTRAVENOUS

## 2020-12-13 MED ORDER — OXYCODONE-ACETAMINOPHEN 5-325 MG PO TABS: 2.0000 | ORAL_TABLET | ORAL | Status: DC | PRN

## 2020-12-13 MED ORDER — FENTANYL-BUPIVACAINE-NACL 0.5-0.125-0.9 MG/250ML-% EP SOLN
EPIDURAL | Status: AC
  Filled 2020-12-13: qty 250

## 2020-12-13 MED ORDER — DIPHENHYDRAMINE HCL 25 MG PO CAPS: 25.0000 mg | ORAL_CAPSULE | Freq: Four times a day (QID) | ORAL | Status: DC | PRN

## 2020-12-13 MED ORDER — BENZOCAINE-MENTHOL 20-0.5 % EX AERO: 1.0000 "application " | INHALATION_SPRAY | CUTANEOUS | Status: DC | PRN

## 2020-12-13 MED ORDER — DIPHENHYDRAMINE HCL 50 MG/ML IJ SOLN
12.5000 mg | INTRAMUSCULAR | Status: DC | PRN
Start: 2020-12-13 — End: 2020-12-13

## 2020-12-13 MED ORDER — OXYTOCIN-SODIUM CHLORIDE 30-0.9 UT/500ML-% IV SOLN
1.0000 m[IU]/min | INTRAVENOUS | Status: DC
  Administered 2020-12-13: 2 m[IU]/min via INTRAVENOUS
  Filled 2020-12-13: qty 500

## 2020-12-13 MED ORDER — LACTATED RINGERS IV SOLN: INTRAVENOUS | Status: DC

## 2020-12-13 MED ORDER — ONDANSETRON HCL 4 MG/2ML IJ SOLN: 4.0000 mg | INTRAMUSCULAR | Status: DC | PRN

## 2020-12-13 MED ORDER — SIMETHICONE 80 MG PO CHEW: 80.0000 mg | CHEWABLE_TABLET | ORAL | Status: DC | PRN

## 2020-12-13 MED ORDER — WITCH HAZEL-GLYCERIN EX PADS: 1.0000 "application " | MEDICATED_PAD | CUTANEOUS | Status: DC | PRN

## 2020-12-13 MED ORDER — ONDANSETRON HCL 4 MG PO TABS: 4.0000 mg | ORAL_TABLET | ORAL | Status: DC | PRN

## 2020-12-13 MED ORDER — TERBUTALINE SULFATE 1 MG/ML IJ SOLN: 0.2500 mg | Freq: Once | INTRAMUSCULAR | Status: DC | PRN

## 2020-12-13 MED ORDER — OXYTOCIN BOLUS FROM INFUSION
333.0000 mL | Freq: Once | INTRAVENOUS | Status: AC
  Administered 2020-12-13: 333 mL via INTRAVENOUS

## 2020-12-13 MED ORDER — DIBUCAINE (PERIANAL) 1 % EX OINT: 1.0000 "application " | TOPICAL_OINTMENT | CUTANEOUS | Status: DC | PRN

## 2020-12-13 NOTE — Anesthesia Postprocedure Evaluation (Signed)
Anesthesia Post Note  Patient: Ann Farmer  Procedure(s) Performed: AN AD HOC LABOR EPIDURAL     Patient location during evaluation: Mother Baby Anesthesia Type: Epidural Level of consciousness: awake and alert Pain management: pain level controlled Vital Signs Assessment: post-procedure vital signs reviewed and stable Respiratory status: spontaneous breathing, nonlabored ventilation and respiratory function stable Cardiovascular status: stable Postop Assessment: no headache, no backache and epidural receding Anesthetic complications: no   No notable events documented.  Last Vitals:  Vitals:   12/13/20 1040 12/13/20 1135  BP: 117/72 119/67  Pulse: 67 70  Resp: 18 18  Temp: 36.5 C 36.7 C  SpO2: 100% 99%    Last Pain:  Vitals:   12/13/20 1135  TempSrc: Oral  PainSc: 3    Pain Goal: Patients Stated Pain Goal: 2 (12/13/20 1135)                 Marliss Buttacavoli

## 2020-12-13 NOTE — Anesthesia Procedure Notes (Signed)
Epidural Patient location during procedure: OB Start time: 12/13/2020 7:08 AM End time: 12/13/2020 7:20 AM  Staffing Anesthesiologist: Lowella Curb, MD Performed: anesthesiologist   Preanesthetic Checklist Completed: patient identified, IV checked, site marked, risks and benefits discussed, surgical consent, monitors and equipment checked, pre-op evaluation and timeout performed  Epidural Patient position: sitting Prep: ChloraPrep Patient monitoring: heart rate, cardiac monitor, continuous pulse ox and blood pressure Approach: midline Location: L2-L3 Injection technique: LOR saline  Needle:  Needle type: Tuohy  Needle gauge: 17 G Needle length: 9 cm Needle insertion depth: 5 cm Catheter type: closed end flexible Catheter size: 20 Guage Catheter at skin depth: 9 cm Test dose: negative  Assessment Events: blood not aspirated, injection not painful, no injection resistance, no paresthesia and negative IV test  Additional Notes Reason for block:procedure for pain

## 2020-12-13 NOTE — Anesthesia Preprocedure Evaluation (Signed)
Anesthesia Evaluation  Patient identified by MRN, date of birth, ID band Patient awake    Reviewed: Allergy & Precautions, NPO status , Patient's Chart, lab work & pertinent test results  History of Anesthesia Complications Negative for: history of anesthetic complications  Airway Mallampati: II  TM Distance: >3 FB Neck ROM: Full    Dental  (+) Dental Advisory Given   Pulmonary neg pulmonary ROS,    breath sounds clear to auscultation       Cardiovascular hypertension,  Rhythm:Regular Rate:Normal     Neuro/Psych Depression negative neurological ROS     GI/Hepatic negative GI ROS, Neg liver ROS,   Endo/Other  negative endocrine ROS  Renal/GU negative Renal ROS     Musculoskeletal   Abdominal (+) + obese,   Peds  Hematology Hb 10.8, plt 253k   Anesthesia Other Findings   Reproductive/Obstetrics (+) Pregnancy H/o pre-eclampsia with prior pregnancy                             Anesthesia Physical  Anesthesia Plan  ASA: II  Anesthesia Plan: Epidural   Post-op Pain Management:    Induction:   PONV Risk Score and Plan: Treatment may vary due to age or medical condition  Airway Management Planned: Natural Airway  Additional Equipment:   Intra-op Plan:   Post-operative Plan:   Informed Consent: I have reviewed the patients History and Physical, chart, labs and discussed the procedure including the risks, benefits and alternatives for the proposed anesthesia with the patient or authorized representative who has indicated his/her understanding and acceptance.     Dental advisory given  Plan Discussed with:   Anesthesia Plan Comments: (Patient identified. Risks/Benefits/Options discussed with patient including but not limited to bleeding, infection, nerve damage, paralysis, failed block, incomplete pain control, headache, blood pressure changes, nausea, vomiting, reactions to  medication both or allergic, itching and postpartum back pain. Confirmed with bedside nurse the patient's most recent platelet count. Confirmed with patient that they are not currently taking any anticoagulation, have any bleeding history or any family history of bleeding disorders. Patient expressed understanding and wished to proceed. All questions were answered. )        Anesthesia Quick Evaluation

## 2020-12-13 NOTE — Progress Notes (Signed)
OBGYN note S: reports contractions more intense O: Vitals:   12/13/20 0530 12/13/20 0602 12/13/20 0606 12/13/20 0630  BP: 135/84 136/75  130/75  Pulse: 89 80  94  Resp: 17 20    Temp:      TempSrc:      SpO2:  99% 97%   Weight:      Height:       FHR 125, moderate variability, early decelerations, Cat 1 Toco q46m  SVE 5/80/-2  Ann Farmer 25 y.o. [id number] at [redacted]w[redacted]d here augmentation of latent labor -Continue pitocin, anticipate SVD [name] 12/13/20 6:43 AM

## 2020-12-13 NOTE — H&P (Signed)
Ann Farmer is a 25 y.o. female [phone] [redacted]w[redacted]d presenting for labor check, reports contractions, LOF and decreased fetal movement. 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 Positive for the g.27134T>G variant Hemoglobin variant: Hgb C trait (heterozygous) History of preeclampsia G1 Obesity: prepreg BMI 32  OB History     Gravida  4   Para  2   Term  2   Preterm  0   AB  1   Living  2      SAB  1   IAB  0   Ectopic  0   Multiple      Live Births  2        Obstetric Comments  [facility] after having pain and being evaluated at [facility]- she was told there was no pregnancy. 07/2015 baby was documented twice, deleted the duplicated 07/2015        Past Medical History:  Diagnosis Date   Anemia    Chlamydia    Hemoglobin C trait (HCC)    Pregnancy induced hypertension    previous preg   Past Surgical History:  Procedure Laterality Date   FRACTURE SURGERY     at 4 years   HERNIA REPAIR     as an infant   Family History: family history includes Asthma in an other family member. Social History:  reports that she has never smoked. She has never used smokeless tobacco. She reports that she does not drink alcohol and does not use drugs.     Maternal Diabetes: No Genetic Screening: Normal - low risk panorama  Maternal Ultrasounds/Referrals: Normal Fetal Ultrasounds or other Referrals:  Referred to Materal Fetal Medicine to anatomy Maternal Substance Abuse:  No Significant Maternal Medications:  None Significant Maternal Lab Results:  Group B Strep negative Other Comments:   Cystic fibrosis, SMA carrier, hemoglobin C trait, anemia, obesity    Review of Systems Per HPI Exam Physical Exam  Dilation: 4.5 Effacement (%): 80 Station: -2 Exam by:: [place] Clemons RN Blood pressure 129/67, pulse 78, temperature 98 F (36.7 C), temperature source Oral, resp. rate 17, height 5\' 4"  (1.626 m), weight 94 kg, last menstrual period 03/15/2020, SpO2 99 %, unknown if currently breastfeeding. NAD, resting comfortably, painful with contractions Gravid abdomen Card RRR Pulm CTAB Lower extremity trace edema, no evidence of DVT Fetal testing: FHR 120, Cat 1. Toco q16mins Prenatal labs: ABO, Rh:  --/--/O POS (08/01 0110) Antibody: NEG (08/01 0110) Rubella: Immune (01/10 0000) RPR: Nonreactive (05/19 0000)  HBsAg: Negative (01/10 0000)  HIV: Non-reactive (01/10 0000)  GBS: Negative/-- (07/13 0000)   Hematology Recent Labs  Lab 12/13/20 0110  WBC 11.5*  RBC 3.50*  HGB 9.6*  HCT 27.8*  MCV 79.4*  MCH 27.4  MCHC 34.5  RDW 15.5  PLT 269    Assessment/Plan: Ann Farmer is a 25 y.o. female [phone] [redacted]w[redacted]d admitted with labor Labor: in MAU SVE changed from 3/70/-2 to 4.5/80/-2, contracting. Amnisure negative. After 2h on L&D patient had minimal change, pitocin started and now s/p AROM GBS negative  Fetal monitoring: initially FHR 170, now 150-160. Cat 1. Continue to monitor Anemia: received feraheme 6/9, did have mild reaction. Hgb on admit 9.6 Cystic fibroid  carrier, increased carrier risk for Spinal Muscular Atrophy, Hgb C trait (heterozygous). Reports FOB tested in previous pregnancy and negative Obesity: prepreg BMI 32. Last EFW 33% 10/27/2020 Vaccines: s/p tdap in pregnancy. Did not complete COVID/flu  Malike Foglio K Taam-Akelman 12/13/2020, 5:13 AM

## 2020-12-13 NOTE — Lactation Note (Signed)
This note was copied from a baby's chart. Lactation Consultation Note  Patient Name: Ann Farmer [id number] Date: 12/13/2020 Reason for consult: Initial assessment;Term Age:27 hours   P3 mother whose infant is now 57 hours old.  This is a term baby at 39+0 weeks.  Mother breast fed her first two children ( now 40 and 38 years old) for 4-6 months each.  She is hoping to breast feed this baby for a longer duration.  Baby swaddled and asleep when I arrived.  Reviewed feeding cues and breast feeding basics with mother.  She is familiar with hand expression and has been able to express colostrum drops.  Demonstrated finger feeding and spoon feeding; reviewed tummy size.  Encouraged to feed 8-12 times/24 hours or sooner if baby shows feeding cues.  Offered to return for latch assistance as needed.  Mother appreciative.  Mom made aware of O/P services, breastfeeding support groups, community resources, and our phone # for post-discharge questions.  Support person present at this time.   Maternal Data Has patient been taught Hand Expression?: Yes Does the patient have breastfeeding experience prior to this delivery?: Yes How long did the patient breastfeed?: 4-6 months  Feeding Mother's Current Feeding Choice: Breast Milk  LATCH Score Latch: Grasps breast easily, tongue down, lips flanged, rhythmical sucking. (per mom)  Audible Swallowing: A few with stimulation  Type of Nipple: Everted at rest and after stimulation  Comfort (Breast/Nipple): Soft / non-tender  Hold (Positioning): No assistance needed to correctly position infant at breast.  LATCH Score: 9   Lactation Tools Discussed/Used    Interventions Interventions: Education  Discharge    Consult Status Consult Status: Follow-up Date: 12/14/20 Follow-up type: In-patient    Ann Farmer 12/13/2020, 11:54 AM

## 2020-12-13 NOTE — Lactation Note (Signed)
This note was copied from a baby's chart. Lactation Consultation Note  Patient Name: Ann Farmer [id number] Date: 12/13/2020 Reason for consult: L&D Initial assessment- mom and Exp BF, P3  Age:27 mins PP for LC 's 1st visit , baby STS with mom and latched on the left breast with depth and per mom  had started at 9 :10 am . LC noted swallows and baby released easily hand expressed. Baby rooting and LC assisted mom to switch to the right and baby latched for a few sucks and released . Baby STS on moms chest. See more details below.  Mom asked how often to feed the baby and pump.  LC answered mom BF questions and explained what the 1st few days of BF are like.  LC Plan reviewed with mom:  Breast feeding goal - is to feed with feeding cues 8-12 times a day.  Benefits of STS with feedings and in between.    Maternal Data Has patient been taught Hand Expression?: Yes (easily hand expressed after the feeding large drop)  Feeding Mother's Current Feeding Choice: Breast Milk  LATCH Score - baby had latched easily on the left breast and fed 15 mins and when switched , rooting at 1st, latched for a few sucks and released . STS with mom.  Latch: Grasps breast easily, tongue down, lips flanged, rhythmical sucking.  Audible Swallowing: None  Type of Nipple: Everted at rest and after stimulation (some areola edema)  Comfort (Breast/Nipple): Soft / non-tender  Hold (Positioning): Assistance needed to correctly position infant at breast and maintain latch.  LATCH Score: 7   Lactation Tools Discussed/Used    Interventions Interventions: Breast feeding basics reviewed;Assisted with latch;Skin to skin;Reverse pressure;Adjust position;Support pillows;Education  Discharge    Consult Status Consult Status: Follow-up (from L/D) Date: 12/13/20 Follow-up type: In-patient    [name] 12/13/2020, 9:45 AM

## 2020-12-14 LAB — CBC
HCT: 25.3 % — ABNORMAL LOW (ref 36.0–46.0)
Hemoglobin: 9 g/dL — ABNORMAL LOW (ref 12.0–15.0)
MCH: 27.9 pg (ref 26.0–34.0)
MCHC: 35.6 g/dL (ref 30.0–36.0)
MCV: 78.3 fL — ABNORMAL LOW (ref 80.0–100.0)
Platelets: 258 10*3/uL (ref 150–400)
RBC: 3.23 MIL/uL — ABNORMAL LOW (ref 3.87–5.11)
RDW: 15.3 % (ref 11.5–15.5)
WBC: 12.5 10*3/uL — ABNORMAL HIGH (ref 4.0–10.5)
nRBC: 0 % (ref 0.0–0.2)

## 2020-12-14 NOTE — Discharge Summary (Signed)
Postpartum Discharge Summary  Patient Name: Ann Farmer DOB: 07/08/1995 MRN: [id number]  Date of admission: 12/12/2020 Delivery date:12/13/2020  Delivering provider: Jerelyn Charles  Date of discharge: 12/14/2020  Admitting diagnosis: Normal labor [O80, Z37.9] Intrauterine pregnancy: [redacted]w[redacted]d    Secondary diagnosis:  Active Problems:   * No active hospital problems. *  Additional problems: none    Discharge diagnosis: Term Pregnancy Delivered                                              Post partum procedures: none Augmentation: N/A Complications: None  Hospital course: Onset of Labor With Vaginal Delivery      25y.o. yo [id number] 27w0das admitted in Active Labor on 12/12/2020. Patient had an uncomplicated labor course as follows:  Membrane Rupture Time/Date: 5:14 AM ,12/13/2020   Delivery Method:Vaginal, Spontaneous  Episiotomy: None  Lacerations:  None  Patient had an uncomplicated postpartum course.  She is ambulating, tolerating a regular diet, passing flatus, and urinating well. Patient is discharged home in stable condition on 12/14/20.  Newborn Data: Birth date:12/13/2020  Birth time:8:28 AM  Gender:Female  Living status:Living  Apgars:8 ,9  Weight:3260 g   Magnesium Sulfate received: No BMZ received: No Rhophylac:N/A MMR:N/A T-DaP:Given prenatally Flu: No Transfusion:No  Physical exam  Vitals:   12/13/20 1135 12/13/20 1630 12/13/20 2019 12/14/20 0500  BP: 119/67 117/69 130/77 123/72  Pulse: 70 70 67 75  Resp: _0 Temp: 98 F (36.7 C) 98.7 F (37.1 C) 98.1 F (36.7 C) 97.8 F (36.6 C)  TempSrc: Oral Oral Axillary Oral  SpO2: 99% 98% 97% 99%  Weight:      Height:       General: alert, cooperative, and no distress Lochia: appropriate Uterine Fundus: firm Incision: N/A DVT Evaluation: No evidence of DVT seen on physical exam. Labs: Lab Results  Component Value Date   WBC 12.5 (H) 12/14/2020   HGB 9.0 (L) 12/14/2020   HCT 25.3 (L)  12/14/2020   MCV 78.3 (L) 12/14/2020   PLT 258 12/14/2020   CMP Latest Ref Rng & Units 08/10/2015  Glucose 65 - 99 mg/dL 89  BUN 6 - 20 mg/dL 13  Creatinine 0.44 - 1.00 mg/dL 0.75  Sodium 135 - 145 mmol/L 138  Potassium 3.5 - 5.1 mmol/L 3.7  Chloride 101 - 111 mmol/L 111  CO2 22 - 32 mmol/L 20(L)  Calcium 8.9 - 10.3 mg/dL 8.6(L)  Total Protein 6.5 - 8.1 g/dL 6.2(L)  Total Bilirubin 0.3 - 1.2 mg/dL 0.5  Alkaline Phos 38 - 126 U/L 170(H)  AST 15 - 41 U/L 21  ALT 14 - 54 U/L 12(L)   Edinburgh Score: Edinburgh Postnatal Depression Scale Screening Tool 12/13/2020  I have been able to laugh and see the funny side of things. 0  I have looked forward with enjoyment to things. 0  I have blamed myself unnecessarily when things went wrong. 0  I have been anxious or worried for no good reason. 1  I have felt scared or panicky for no good reason. 0  Things have been getting on top of me. 0  I have been so unhappy that I have had difficulty sleeping. 0  I have felt sad or miserable. 0  I have been so unhappy that I have been crying. 0  The  thought of harming myself has occurred to me. 0  Edinburgh Postnatal Depression Scale Total 1      After visit meds:  Allergies as of 12/14/2020       Reactions   Feraheme [ferumoxytol] Swelling    right periorbital edema and sneezing         Medication List     STOP taking these medications    prenatal vitamin w/FE, FA 27-1 MG Tabs tablet       TAKE these medications    ferrous sulfate 325 (65 FE) MG tablet Take 325 mg by mouth daily with breakfast.   ipratropium 0.06 % nasal spray Commonly known as: Atrovent Place 1 spray into both nostrils 3 (three) times daily.   prenatal multivitamin Tabs tablet Take 1 tablet by mouth daily at 12 noon.         Discharge home in stable condition Infant Feeding: Breast Infant Disposition:home with mother Discharge instruction: per After Visit Summary and Postpartum booklet. Activity:  Advance as tolerated. Pelvic rest for 6 weeks.  Diet: routine diet Anticipated Birth Control: Unsure Postpartum Appointment:4 weeks Additional Postpartum F/U: Postpartum Depression checkup Future Appointments: Future Appointments  Date Time Provider [place]  12/16/2020  9:00 AM [facility] None   Follow up Visit:  Follow-up Information     Jerelyn Charles, MD Follow up in 4 week(s).   Specialty: Obstetrics Contact information: [address] [place] [address] [phone]                     12/14/2020 Allyn Kenner, DO

## 2020-12-15 ENCOUNTER — Telehealth (HOSPITAL_COMMUNITY): Payer: Self-pay

## 2020-12-15 NOTE — Telephone Encounter (Signed)
Spoke with Amy at [facility] to ask for clarification about iron administration for this pt scheduled for 12/16/20. Amy states the pt delivered her baby on 12/12/20 and there is no indication from Dr. Henderson Cloud that pt needs to return for scheduled iron infusion, appointment can be canceled. Scheduler made aware.

## 2020-12-15 NOTE — Telephone Encounter (Signed)
Call back number to [facility] provided in VM in regard to canceled iron infusion.

## 2020-12-15 NOTE — Telephone Encounter (Signed)
Left VM for pt notifying her that the appointment scheduled for 12/16/20 for iron infusion has been canceled, as instructed by [facility] office.

## 2020-12-16 ENCOUNTER — Encounter (HOSPITAL_COMMUNITY): Payer: No Typology Code available for payment source

## 2020-12-27 ENCOUNTER — Telehealth (HOSPITAL_COMMUNITY): Payer: Self-pay

## 2020-12-27 NOTE — Telephone Encounter (Signed)
  No answer. Left message to return nurse call.  Marcelino Duster [facility] 12/27/2020,1443

## 2022-02-21 LAB — OB RESULTS CONSOLE RUBELLA ANTIBODY, IGM: Rubella: IMMUNE

## 2022-02-21 LAB — OB RESULTS CONSOLE RPR: RPR: NONREACTIVE

## 2022-02-21 LAB — HEPATITIS C ANTIBODY: HCV Ab: NEGATIVE

## 2022-02-21 LAB — OB RESULTS CONSOLE HIV ANTIBODY (ROUTINE TESTING): HIV: NONREACTIVE

## 2022-02-21 LAB — OB RESULTS CONSOLE HEPATITIS B SURFACE ANTIGEN: Hepatitis B Surface Ag: NEGATIVE

## 2022-05-15 NOTE — L&D Delivery Note (Signed)
Delivery Note Ann Farmer is a [id number] at [redacted]w[redacted]d who had a spontaneous delivery at 2300 a viable female [name]") was delivered via direct OA.  APGAR: 9, 9; weight 7lb 13.2oz (3550g).     Admitted for labor. Progressed normally. Received epidural for pain management. AROM at 2140. Progressed to complete at 2257. Pushed for 3 minutes. Baby was delivered without difficulty. No nuchal cord.  Delayed cord clamping for 60 seconds.  Delivery of placenta was spontaneous. Placenta was found to be intact, 3 -vessel cord was noted. The fundus was found to be firm. Perineum intact. Estimated blood loss 300cc. Instrument and gauze counts were correct at the end of the procedure.   Placenta status: to L&D  Anesthesia:  epidural Episiotomy:  none Lacerations:  none Suture Repair: N/A Est. Blood Loss (mL):  300  Mom to postpartum.  Baby to Couplet care / Skin to Skin. The couple desires a circumcision for their baby boy.   [name] 09/04/2022, 11:25 PM

## 2022-08-23 LAB — OB RESULTS CONSOLE GBS: GBS: NEGATIVE

## 2022-08-25 ENCOUNTER — Encounter (HOSPITAL_COMMUNITY): Payer: Self-pay | Admitting: *Deleted

## 2022-08-25 ENCOUNTER — Inpatient Hospital Stay (HOSPITAL_COMMUNITY)
Admission: AD | Admit: 2022-08-25 | Discharge: 2022-08-26 | Disposition: A | Discharge status: Home / Self Care | Payer: 59 | Attending: Obstetrics and Gynecology | Admitting: Obstetrics and Gynecology

## 2022-08-25 DIAGNOSIS — Z3689 Encounter for other specified antenatal screening: Secondary | ICD-10-CM

## 2022-08-25 DIAGNOSIS — Z3A37 37 weeks gestation of pregnancy: Secondary | ICD-10-CM | POA: Insufficient documentation

## 2022-08-25 DIAGNOSIS — Z0371 Encounter for suspected problem with amniotic cavity and membrane ruled out: Secondary | ICD-10-CM

## 2022-08-25 DIAGNOSIS — O471 False labor at or after 37 completed weeks of gestation: Secondary | ICD-10-CM | POA: Insufficient documentation

## 2022-08-25 LAB — POCT FERN TEST: POCT Fern Test: NEGATIVE

## 2022-08-25 NOTE — MAU Note (Signed)
Pt says feels UC's strong since 730pm Thinks SROM- at 6pm-  while walking dog- felt fluid run out- - and has continued. [facility]- Dr Tenny Craw VE - 1-2 cm Denies HSV GBS- collected at last appointment

## 2022-08-25 NOTE — MAU Provider Note (Signed)
S: [name] is a 27 y.o. [phone] at [redacted]w[redacted]d  who presents to MAU today complaining of leaking of fluid since 1600. She denies vaginal bleeding. She endorses contractions. She reports normal fetal movement.    O: BP (!) 117/59 (BP Location: Right Arm)   Pulse 99   Temp 98.4 F (36.9 C) (Oral)   Resp 16   Ht 5\' 4"  (1.626 m)   Wt 96.2 kg   BMI 36.41 kg/m  GENERAL: Well-developed, well-nourished female in no acute distress.  HEAD: Normocephalic, atraumatic.  CHEST: Normal effort of breathing, regular heart rate ABDOMEN: Soft, nontender, gravid PELVIC: Normal external female genitalia. Vagina is pink and rugated. Cervix with normal contour, no lesions. Normal discharge.  Negative pooling. Fern Collected.  Cervical exam:  Dilation: 3 Effacement (%): 40 Cervical Position: Posterior Station: -3 Presentation: Vertex Exam by:: Santiago Bur, RN   Fetal Monitoring: FHT: 135 bpm, Mod Var, -Decels, +Accels Toco: Ctx Q 8-10 min  No results found for this or any previous visit (from the past 24 hour(s)).   A: SIUP at [redacted]w[redacted]d  Membranes intact  P: Plan to monitor and recheck in two hours. If no cervical change, plan for discharge.   Gerrit Heck, CNM 08/25/2022 10:43 PM  Reassessment (1:22 AM) Dilation: 4 Effacement (%): 70 Cervical Position: Posterior Station: -3 Presentation: Vertex Exam by:: Santiago Bur, RN   After 3 hours of monitoring patient with minimal cervical change. NST reactive Discharge to home in stable condition.  Cherre Robins MSN, CNM Advanced Practice Provider, [facility] [employer] [facility]

## 2022-08-26 ENCOUNTER — Encounter (HOSPITAL_COMMUNITY): Payer: Self-pay | Admitting: Obstetrics and Gynecology

## 2022-08-26 ENCOUNTER — Inpatient Hospital Stay (EMERGENCY_DEPARTMENT_HOSPITAL)
Admission: AD | Admit: 2022-08-26 | Discharge: 2022-08-26 | Disposition: A | Discharge status: Home / Self Care | Payer: 59 | Source: Home / Self Care | Attending: Obstetrics and Gynecology | Admitting: Obstetrics and Gynecology

## 2022-08-26 DIAGNOSIS — Z0371 Encounter for suspected problem with amniotic cavity and membrane ruled out: Secondary | ICD-10-CM | POA: Diagnosis not present

## 2022-08-26 DIAGNOSIS — O471 False labor at or after 37 completed weeks of gestation: Secondary | ICD-10-CM | POA: Insufficient documentation

## 2022-08-26 DIAGNOSIS — O479 False labor, unspecified: Secondary | ICD-10-CM

## 2022-08-26 DIAGNOSIS — Z3A37 37 weeks gestation of pregnancy: Secondary | ICD-10-CM

## 2022-08-26 NOTE — MAU Provider Note (Signed)
S: [name] is a 27 y.o. [phone] at [redacted]w[redacted]d  who presents to MAU today complaining of contractions q 6 minutes . She denies vaginal bleeding. She denies LOF. She reports normal fetal movement.    O: BP 116/60 (BP Location: Left Arm)   Pulse 87   Temp 98.8 F (37.1 C) (Oral)   Resp 18   Wt 95.4 kg   SpO2 97%   BMI 36.10 kg/m   GENERAL: Well-developed, well-nourished female in no acute distress.  HEAD: Normocephalic, atraumatic.  CHEST: Normal effort of breathing, regular heart rate ABDOMEN: Soft, nontender, gravid  Cervical exam:  Dilation: 3 Effacement (%): 40 Station: -3 Exam by:: Valla Leaver, RN  Fetal Monitoring: Baseline 145, mod var, + 15 x 15 accels, no decels Contractions: Irregular  A: SIUP at [redacted]w[redacted]d  Cervix remains 3cm 90 min after initial exam Patient reports to [place], RN that she is comfortable going home  P: Discharge home in stable condition  Clayton Bibles, [place] 08/26/2022 4:04 PM

## 2022-08-26 NOTE — MAU Note (Signed)
.  Ann Farmer is a 27 y.o. at [redacted]w[redacted]d here in MAU reporting: ctx that started at 1200, she states they are every 6 minutes. Denies VB but did feel a gush of fluid when she was walking in. Was 4cm last night.   Pain score: 7 Vitals:   08/26/22 1330  BP: 133/68  Pulse: (!) 103  Resp: 14  Temp: 98.8 F (37.1 C)  SpO2: 98%     FHT:148 Lab orders placed from triage:  mau labor

## 2022-08-28 ENCOUNTER — Encounter (HOSPITAL_COMMUNITY): Payer: Self-pay | Admitting: Obstetrics and Gynecology

## 2022-08-28 ENCOUNTER — Inpatient Hospital Stay (HOSPITAL_COMMUNITY)
Admission: AD | Admit: 2022-08-28 | Discharge: 2022-08-28 | Disposition: A | Discharge status: Home / Self Care | Payer: 59 | Attending: Obstetrics and Gynecology | Admitting: Obstetrics and Gynecology

## 2022-08-28 DIAGNOSIS — O471 False labor at or after 37 completed weeks of gestation: Secondary | ICD-10-CM | POA: Diagnosis not present

## 2022-08-28 DIAGNOSIS — Z3A37 37 weeks gestation of pregnancy: Secondary | ICD-10-CM

## 2022-08-28 DIAGNOSIS — Z0371 Encounter for suspected problem with amniotic cavity and membrane ruled out: Secondary | ICD-10-CM

## 2022-08-28 LAB — AMNISURE RUPTURE OF MEMBRANE (ROM) NOT AT ARMC: Amnisure ROM: NEGATIVE

## 2022-08-28 LAB — POCT FERN TEST

## 2022-08-28 NOTE — Discharge Instructions (Signed)

## 2022-08-28 NOTE — MAU Note (Signed)
.  Ann Farmer is a 27 y.o. at [redacted]w[redacted]d here in MAU reporting:   Contractions every: 5 minutes Onset of ctx: Yesterday Pain score: 5/10  ROM: Possible ROM 0015 gush of clear fluid  Vaginal Bleeding: None Last SVE: 4 cm Friday  No recent intercourse  Epidural: Not planning  Fetal Movement: Reports positive FM FHT:130 via External  Vitals:   08/28/22 0111  BP: 128/68  Pulse: 92  Resp: 18  Temp: 98.5 F (36.9 C)  SpO2: 99%       OB Office: [place] GBS: Negative HSV: Denies hx of HSV Lab orders placed from triage: MAU Labor Eval

## 2022-08-28 NOTE — MAU Note (Signed)
Fern inconclusive - CNM verified-amnisure ordered

## 2022-08-28 NOTE — MAU Provider Note (Signed)
S: [name] is a 27 y.o. [phone] at [redacted]w[redacted]d  who presents to MAU today complaining of leaking of fluid since 0015. She denies vaginal bleeding. She endorses contractions. She reports normal fetal movement.    O: BP 128/68 (BP Location: Right Arm)   Pulse 92   Temp 98.5 F (36.9 C) (Oral)   Resp 18   Ht 5\' 4"  (1.626 m)   Wt 98.2 kg   SpO2 99%   BMI 37.16 kg/m  GENERAL: Well-developed, well-nourished female in no acute distress.  HEAD: Normocephalic, atraumatic.  CHEST: Normal effort of breathing, regular heart rate ABDOMEN: Soft, nontender, gravid  Cervical exam:  Dilation: 1 Effacement (%): 40 Cervical Position: Posterior Station: -3 Presentation: Vertex Exam by:: Santiago Bur RN   Fetal Monitoring: Baseline: 125 Variability: moderate Accelerations: 15x15 Decelerations: none Contractions: occasional uc's  Results for orders placed or performed during the hospital encounter of 08/28/22 (from the past 24 hour(s))  POCT fern test     Status: Normal   Collection Time: 08/28/22  1:45 AM  Result Value Ref Range   POCT Fern Test     Negative    Amnisure rupture of membrane (rom)not at [facility]     Status: None   Collection Time: 08/28/22  2:00 AM  Result Value Ref Range   Amnisure ROM NEGATIVE    Amnisure collected as this is second presentation for possible SROM in 48 hours.   Cervix rechecked after 1.5 hours and unchanged.   A: SIUP at [redacted]w[redacted]d  Membranes intact  P: -Discharge home in stable condition -Labor precautions discussed -Patient advised to follow-up with OB as scheduled for prenatal care -Patient may return to MAU as needed or if her condition were to change or worsen   Rolm Bookbinder, [place] 08/28/2022 1:45 AM

## 2022-09-04 ENCOUNTER — Other Ambulatory Visit: Payer: Self-pay

## 2022-09-04 ENCOUNTER — Inpatient Hospital Stay (HOSPITAL_COMMUNITY)
Admission: AD | Admit: 2022-09-04 | Discharge: 2022-09-06 | DRG: 807 | Disposition: A | Discharge status: Home / Self Care | Payer: 59 | Attending: Obstetrics and Gynecology | Admitting: Obstetrics and Gynecology

## 2022-09-04 ENCOUNTER — Inpatient Hospital Stay (HOSPITAL_COMMUNITY): Payer: 59 | Admitting: Anesthesiology

## 2022-09-04 ENCOUNTER — Encounter (HOSPITAL_COMMUNITY): Payer: Self-pay | Admitting: Obstetrics and Gynecology

## 2022-09-04 DIAGNOSIS — Z3A38 38 weeks gestation of pregnancy: Secondary | ICD-10-CM

## 2022-09-04 DIAGNOSIS — O479 False labor, unspecified: Principal | ICD-10-CM | POA: Diagnosis present

## 2022-09-04 DIAGNOSIS — O26893 Other specified pregnancy related conditions, third trimester: Secondary | ICD-10-CM | POA: Diagnosis present

## 2022-09-04 LAB — TYPE AND SCREEN
ABO/RH(D): O POS
Antibody Screen: NEGATIVE

## 2022-09-04 LAB — CBC
HCT: 32.3 % — ABNORMAL LOW (ref 36.0–46.0)
Hemoglobin: 11.2 g/dL — ABNORMAL LOW (ref 12.0–15.0)
MCH: 27.1 pg (ref 26.0–34.0)
MCHC: 34.7 g/dL (ref 30.0–36.0)
MCV: 78.2 fL — ABNORMAL LOW (ref 80.0–100.0)
Platelets: 295 10*3/uL (ref 150–400)
RBC: 4.13 MIL/uL (ref 3.87–5.11)
RDW: 14.6 % (ref 11.5–15.5)
WBC: 13.8 10*3/uL — ABNORMAL HIGH (ref 4.0–10.5)
nRBC: 0 % (ref 0.0–0.2)

## 2022-09-04 LAB — HIV ANTIBODY (ROUTINE TESTING W REFLEX): HIV Screen 4th Generation wRfx: NONREACTIVE

## 2022-09-04 MED ORDER — PHENYLEPHRINE 80 MCG/ML (10ML) SYRINGE FOR IV PUSH (FOR BLOOD PRESSURE SUPPORT)
80.0000 ug | PREFILLED_SYRINGE | INTRAVENOUS | Status: DC | PRN
  Filled 2022-09-04: qty 10

## 2022-09-04 MED ORDER — FENTANYL CITRATE (PF) 100 MCG/2ML IJ SOLN: 50.0000 ug | INTRAMUSCULAR | Status: DC | PRN

## 2022-09-04 MED ORDER — EPHEDRINE 5 MG/ML INJ: 10.0000 mg | INTRAVENOUS | Status: DC | PRN

## 2022-09-04 MED ORDER — LACTATED RINGERS IV SOLN: INTRAVENOUS | Status: DC

## 2022-09-04 MED ORDER — SOD CITRATE-CITRIC ACID 500-334 MG/5ML PO SOLN: 30.0000 mL | ORAL | Status: DC | PRN

## 2022-09-04 MED ORDER — LACTATED RINGERS IV SOLN: 500.0000 mL | Freq: Once | INTRAVENOUS | Status: DC

## 2022-09-04 MED ORDER — OXYTOCIN BOLUS FROM INFUSION
333.0000 mL | Freq: Once | INTRAVENOUS | Status: AC
  Administered 2022-09-04: 333 mL via INTRAVENOUS

## 2022-09-04 MED ORDER — ONDANSETRON HCL 4 MG/2ML IJ SOLN: 4.0000 mg | Freq: Four times a day (QID) | INTRAMUSCULAR | Status: DC | PRN

## 2022-09-04 MED ORDER — OXYCODONE-ACETAMINOPHEN 5-325 MG PO TABS: 1.0000 | ORAL_TABLET | ORAL | Status: DC | PRN

## 2022-09-04 MED ORDER — LIDOCAINE HCL (PF) 1 % IJ SOLN
INTRAMUSCULAR | Status: DC | PRN
  Administered 2022-09-04: 11 mL via EPIDURAL

## 2022-09-04 MED ORDER — LACTATED RINGERS IV SOLN: 500.0000 mL | INTRAVENOUS | Status: DC | PRN

## 2022-09-04 MED ORDER — LIDOCAINE HCL (PF) 1 % IJ SOLN: 30.0000 mL | INTRAMUSCULAR | Status: DC | PRN

## 2022-09-04 MED ORDER — PHENYLEPHRINE 80 MCG/ML (10ML) SYRINGE FOR IV PUSH (FOR BLOOD PRESSURE SUPPORT): 80.0000 ug | PREFILLED_SYRINGE | INTRAVENOUS | Status: DC | PRN

## 2022-09-04 MED ORDER — OXYCODONE-ACETAMINOPHEN 5-325 MG PO TABS: 2.0000 | ORAL_TABLET | ORAL | Status: DC | PRN

## 2022-09-04 MED ORDER — FENTANYL-BUPIVACAINE-NACL 0.5-0.125-0.9 MG/250ML-% EP SOLN
12.0000 mL/h | EPIDURAL | Status: DC | PRN
  Administered 2022-09-04: 12 mL/h via EPIDURAL
  Filled 2022-09-04: qty 250

## 2022-09-04 MED ORDER — OXYTOCIN-SODIUM CHLORIDE 30-0.9 UT/500ML-% IV SOLN
2.5000 [IU]/h | INTRAVENOUS | Status: DC
  Administered 2022-09-04: 2.5 [IU]/h via INTRAVENOUS
  Filled 2022-09-04: qty 500

## 2022-09-04 MED ORDER — DIPHENHYDRAMINE HCL 50 MG/ML IJ SOLN: 12.5000 mg | INTRAMUSCULAR | Status: DC | PRN

## 2022-09-04 MED ORDER — ACETAMINOPHEN 325 MG PO TABS: 650.0000 mg | ORAL_TABLET | ORAL | Status: DC | PRN

## 2022-09-04 NOTE — Anesthesia Procedure Notes (Signed)
Epidural Patient location during procedure: OB Start time: 09/04/2022 8:50 PM End time: 09/04/2022 9:08 PM  Staffing Anesthesiologist: Lowella Curb, MD Performed: anesthesiologist   Preanesthetic Checklist Completed: patient identified, IV checked, site marked, risks and benefits discussed, surgical consent, monitors and equipment checked, pre-op evaluation and timeout performed  Epidural Patient position: sitting Prep: ChloraPrep Patient monitoring: heart rate, cardiac monitor, continuous pulse ox and blood pressure Approach: midline Location: L2-L3 Injection technique: LOR saline  Needle:  Needle type: Tuohy  Needle gauge: 17 G Needle length: 9 cm Needle insertion depth: 6 cm Catheter type: closed end flexible Catheter size: 20 Guage Catheter at skin depth: 10 cm Test dose: negative  Assessment Events: blood not aspirated, injection not painful, no injection resistance, no paresthesia and negative IV test  Additional Notes Reason for block:procedure for pain

## 2022-09-04 NOTE — MAU Note (Signed)
.  Ann Farmer is a 27 y.o. at [redacted]w[redacted]d here in MAU reporting: ctx since 6qm about 7 mi apart now. Denies any vag bleeding or leaking of fluid. Good fetal movement felt.  LMP:  Onset of complaint: 6am Pain score: 8 Vitals:   09/04/22 1413  BP: 129/78  Pulse: 95  Resp: 18  Temp: 97.8 F (36.6 C)     FHT:149 Lab orders placed from triage:  labor eval

## 2022-09-04 NOTE — Progress Notes (Signed)
OB Progress Note  S: comfortable s/p epidural   O: Today's Vitals   09/04/22 2115 09/04/22 2120 09/04/22 2125 09/04/22 2130  BP: 132/75 123/66 134/74 137/70  Pulse: 79 78 83 82  Resp:    16  Temp:      TempSrc:      SpO2: 98%     Weight:      Height:      PainSc:    0-No pain   Body mass index is 36.39 kg/m.  SVE 6/70/-1 AROM clear fluid  FHR:  145bpm, mod variability, + accels, no decels Toco: ctx q 3 mins   A/P: 16X [id number] @ [redacted]w[redacted]d, labor Fetal wellbeing: cat I tracing Labor: s/p AROM, anticipate SVD   Pain control: epidural  M. Timothy Lasso, MD 09/04/22 9:47 PM

## 2022-09-04 NOTE — H&P (Signed)
Ann Farmer is a 27 y.o. female [phone] [redacted]w[redacted]d presenting for contractions that started at 6am. No VB or leakage of fluid. Good fetal movement.   In MAU, exam progressed from 2 to 3 cm over 1 hour and noted to be contracting every 3 minutes.   Pregnancy c/b: History of preeclampsia in first pregnancy, has been on ASA this pregnancy Poor weight gain in first and second trimesters,  EFW 58.6% at 26 weeks  OB History     Gravida  5   Para  3   Term  3   Preterm  0   AB  1   Living  3      SAB  1   IAB  0   Ectopic  0   Multiple  0   Live Births  3        Obstetric Comments  [facility] but after having pain and being evaluated at [facility]- she was told there was no pregnancy. 07/2015 baby was documented twice, deleted the duplicated 07/2015        Past Medical History:  Diagnosis Date   Anemia    Chlamydia    Hemoglobin C trait    Pregnancy induced hypertension    previous preg   Past Surgical History:  Procedure Laterality Date   FRACTURE SURGERY     at 4 years   HERNIA REPAIR     as an infant   Family History: family history includes Asthma in an other family member. Social History:  reports that she has never smoked. She has never used smokeless tobacco. She reports that she does not currently use alcohol. She reports that she does not use drugs.     Maternal Diabetes: No Genetic Screening: Declined Maternal Ultrasounds/Referrals: Normal Fetal Ultrasounds or other Referrals:  None Maternal Substance Abuse:  No Significant Maternal Medications:  None Significant Maternal Lab Results:  Group B Strep negative Other Comments:  None  Review of Systems Per HPI Exam Physical Exam  Dilation: 3 Effacement (%): 70 Station: -3 Exam by:: Holly Flippin RN Blood pressure 129/78, pulse 95, temperature 98 F (36.7 C), temperature source Oral, resp. rate 18, height  (1.626 m), weight 96.2 kg, unknown if currently  breastfeeding. Gen: NAD, resting comfortably CVS: normal pulses Lungs: Nonlabored respirations Abd: Gravid abdomen Ext: no calf edema or tenderness Fetal testing: 135bpm, mod variability, + accels,no decels Toco: ctx q 3-4 mins  Prenatal labs: ABO, Rh:  --/--/O POS (04/22 1630) Antibody: NEG (04/22 1630) Rubella: Immune (10/10 0000) RPR: Nonreactive (10/10 0000)  HBsAg: Negative (10/10 0000)  HIV: Non Reactive (04/22 1635)  GBS: Negative/-- (04/10 0000)   Assessment/Plan: 14N [id number] @ [redacted]w[redacted]d, labor Fetal wellbeing: cat I tracing Labor: expectant management, consider AROM after epidural. History of rapid 2nd stage.   Pain control: epidural upon patient request  [name] 09/04/2022, 6:41 PM

## 2022-09-04 NOTE — Anesthesia Preprocedure Evaluation (Signed)
Anesthesia Evaluation  Patient identified by MRN, date of birth, ID band Patient awake    Reviewed: Allergy & Precautions, H&P , NPO status , Patient's Chart, lab work & pertinent test results  History of Anesthesia Complications Negative for: history of anesthetic complications  Airway Mallampati: II  TM Distance: >3 FB Neck ROM: Full    Dental  (+) Dental Advisory Given   Pulmonary neg pulmonary ROS   breath sounds clear to auscultation       Cardiovascular hypertension, negative cardio ROS  Rhythm:Regular Rate:Normal     Neuro/Psych    Depression    negative neurological ROS  negative psych ROS   GI/Hepatic negative GI ROS, Neg liver ROS,,,  Endo/Other  negative endocrine ROS    Renal/GU negative Renal ROS  negative genitourinary   Musculoskeletal negative musculoskeletal ROS (+)    Abdominal  (+) + obese  Peds negative pediatric ROS (+)  Hematology negative hematology ROS (+) Hb 10.8, plt 253k   Anesthesia Other Findings   Reproductive/Obstetrics negative OB ROS H/o pre-eclampsia with prior pregnancy                              Anesthesia Physical Anesthesia Plan  ASA: II  Anesthesia Plan: Epidural   Post-op Pain Management:    Induction:   PONV Risk Score and Plan: Treatment may vary due to age or medical condition  Airway Management Planned: Natural Airway  Additional Equipment:   Intra-op Plan:   Post-operative Plan:   Informed Consent: I have reviewed the patients History and Physical, chart, labs and discussed the procedure including the risks, benefits and alternatives for the proposed anesthesia with the patient or authorized representative who has indicated his/her understanding and acceptance.     Dental advisory given  Plan Discussed with:   Anesthesia Plan Comments: (Patient identified. Risks/Benefits/Options discussed with patient including but not  limited to bleeding, infection, nerve damage, paralysis, failed block, incomplete pain control, headache, blood pressure changes, nausea, vomiting, reactions to medication both or allergic, itching and postpartum back pain. Confirmed with bedside nurse the patient's most recent platelet count. Confirmed with patient that they are not currently taking any anticoagulation, have any bleeding history or any family history of bleeding disorders. Patient expressed understanding and wished to proceed. All questions were answered. )         Anesthesia Quick Evaluation

## 2022-09-04 NOTE — MAU Note (Signed)
Pt informed that the ultrasound is considered a limited OB ultrasound and is not intended to be a complete ultrasound exam.  Patient also informed that the ultrasound is not being completed with the intent of assessing for fetal or placental anomalies or any pelvic abnormalities.  Explained that the purpose of today's ultrasound is to assess for presentation.  Patient acknowledges the purpose of the exam and the limitations of the study.    Vertex presentation confirmed 

## 2022-09-05 ENCOUNTER — Encounter (HOSPITAL_COMMUNITY): Payer: Self-pay | Admitting: Obstetrics and Gynecology

## 2022-09-05 LAB — CBC
HCT: 28.2 % — ABNORMAL LOW (ref 36.0–46.0)
Hemoglobin: 10.1 g/dL — ABNORMAL LOW (ref 12.0–15.0)
MCH: 27.7 pg (ref 26.0–34.0)
MCHC: 35.8 g/dL (ref 30.0–36.0)
MCV: 77.5 fL — ABNORMAL LOW (ref 80.0–100.0)
Platelets: 269 10*3/uL (ref 150–400)
RBC: 3.64 MIL/uL — ABNORMAL LOW (ref 3.87–5.11)
RDW: 14.6 % (ref 11.5–15.5)
WBC: 18.4 10*3/uL — ABNORMAL HIGH (ref 4.0–10.5)
nRBC: 0 % (ref 0.0–0.2)

## 2022-09-05 LAB — RPR: RPR Ser Ql: NONREACTIVE

## 2022-09-05 MED ORDER — WITCH HAZEL-GLYCERIN EX PADS: 1.0000 | MEDICATED_PAD | CUTANEOUS | Status: DC | PRN

## 2022-09-05 MED ORDER — BISACODYL 10 MG RE SUPP: 10.0000 mg | Freq: Every day | RECTAL | Status: DC | PRN

## 2022-09-05 MED ORDER — ONDANSETRON HCL 4 MG/2ML IJ SOLN: 4.0000 mg | INTRAMUSCULAR | Status: DC | PRN

## 2022-09-05 MED ORDER — DIPHENHYDRAMINE HCL 25 MG PO CAPS: 25.0000 mg | ORAL_CAPSULE | Freq: Four times a day (QID) | ORAL | Status: DC | PRN

## 2022-09-05 MED ORDER — DIBUCAINE (PERIANAL) 1 % EX OINT: 1.0000 | TOPICAL_OINTMENT | CUTANEOUS | Status: DC | PRN

## 2022-09-05 MED ORDER — FLEET ENEMA 7-19 GM/118ML RE ENEM: 1.0000 | ENEMA | Freq: Every day | RECTAL | Status: DC | PRN

## 2022-09-05 MED ORDER — COCONUT OIL OIL: 1.0000 | TOPICAL_OIL | Status: DC | PRN

## 2022-09-05 MED ORDER — TETANUS-DIPHTH-ACELL PERTUSSIS 5-2.5-18.5 LF-MCG/0.5 IM SUSY: 0.5000 mL | PREFILLED_SYRINGE | Freq: Once | INTRAMUSCULAR | Status: DC

## 2022-09-05 MED ORDER — OXYCODONE HCL 5 MG PO TABS: 10.0000 mg | ORAL_TABLET | ORAL | Status: DC | PRN

## 2022-09-05 MED ORDER — DOCUSATE SODIUM 100 MG PO CAPS
100.0000 mg | ORAL_CAPSULE | Freq: Two times a day (BID) | ORAL | Status: DC
  Administered 2022-09-05 (×2): 100 mg via ORAL
  Filled 2022-09-05 (×2): qty 1

## 2022-09-05 MED ORDER — BENZOCAINE-MENTHOL 20-0.5 % EX AERO: 1.0000 | INHALATION_SPRAY | CUTANEOUS | Status: DC | PRN

## 2022-09-05 MED ORDER — SIMETHICONE 80 MG PO CHEW
80.0000 mg | CHEWABLE_TABLET | ORAL | Status: DC | PRN
  Administered 2022-09-05: 80 mg via ORAL
  Filled 2022-09-05: qty 1

## 2022-09-05 MED ORDER — IBUPROFEN 600 MG PO TABS
600.0000 mg | ORAL_TABLET | Freq: Four times a day (QID) | ORAL | Status: DC
  Administered 2022-09-05 – 2022-09-06 (×5): 600 mg via ORAL
  Filled 2022-09-05 (×5): qty 1

## 2022-09-05 MED ORDER — ACETAMINOPHEN 325 MG PO TABS
650.0000 mg | ORAL_TABLET | ORAL | Status: DC | PRN
  Administered 2022-09-05: 650 mg via ORAL
  Filled 2022-09-05: qty 2

## 2022-09-05 MED ORDER — ONDANSETRON HCL 4 MG PO TABS: 4.0000 mg | ORAL_TABLET | ORAL | Status: DC | PRN

## 2022-09-05 MED ORDER — OXYCODONE HCL 5 MG PO TABS: 5.0000 mg | ORAL_TABLET | ORAL | Status: DC | PRN

## 2022-09-05 MED ORDER — PRENATAL MULTIVITAMIN CH
1.0000 | ORAL_TABLET | Freq: Every day | ORAL | Status: DC
  Administered 2022-09-05: 1 via ORAL
  Filled 2022-09-05: qty 1

## 2022-09-05 NOTE — Lactation Note (Signed)
This note was copied from a baby's chart. Lactation Consultation Note  Patient Name: Ann Farmer ZOXWR'U Date: 09/05/2022 Age:29 hours Reason for consult: Initial assessment;Breastfeeding assistance;Mother's request;Early term 37-38.6wks  LC entered room, infant was very fussy and in-consolable, Birth Parent hand expressed and infant was given 3 mls of colostrum by spoon and  infant appeared calmer afterwards. Birth Parent latched infant on her left breast using the cross cradle hold, infant sustained latch and was still breastfeeding after 8 minutes when LC left the room. LC reviewed hunger cues, BF infant by cues, 8 to 12+ times within 24 hours , STS. Birth Parent knows to ask RN/LC for further latch assistance if needed. LC discussed infant's input and output. Importance of maternal rest, diet and hydration. Birth Parent was made aware of O/P services, breastfeeding support groups, community resources, and our phone # for post-discharge questions.    Maternal Data Has patient been taught Hand Expression?: Yes Does the patient have breastfeeding experience prior to this delivery?: Yes How long did the patient breastfeed?: Per Birth Parent, she BF 1st child 3 months, 2nd child 6 months and 3rd child for 8 months who is currently 68 year old.  Feeding Mother's Current Feeding Choice: Breast Milk and Formula  LATCH Score Latch: Grasps breast easily, tongue down, lips flanged, rhythmical sucking.  Audible Swallowing: A few with stimulation  Type of Nipple: Everted at rest and after stimulation  Comfort (Breast/Nipple): Soft / non-tender  Hold (Positioning): Assistance needed to correctly position infant at breast and maintain latch.  LATCH Score: 8   Lactation Tools Discussed/Used    Interventions Interventions: Breast feeding basics reviewed;Adjust position;Assisted with latch;Support pillows;Skin to skin;Position options;Education;Breast massage;Expressed milk;Breast  compression;LC Services brochure  Discharge Pump: Personal;DEBP  Consult Status Consult Status: Follow-up Date: 09/05/22 Follow-up type: In-patient    Frederico Hamman 09/05/2022, 2:21 AM

## 2022-09-05 NOTE — Lactation Note (Signed)
This note was copied from a baby's chart. Lactation Consultation Note  Patient Name: Ann Farmer ZOXWR'U Date: 09/05/2022 Age:29 hours RN (Caitlyn) , will ask Birth Parent if she would like to be seen by [facility] services tonight and call LC on vocera.    Maternal Data    Feeding    LATCH Score                    Lactation Tools Discussed/Used    Interventions    Discharge    Consult Status      Ann Farmer 09/05/2022, 1:52 AM

## 2022-09-05 NOTE — Anesthesia Postprocedure Evaluation (Signed)
Anesthesia Post Note  Patient: Ann Farmer  Procedure(s) Performed: AN AD HOC LABOR EPIDURAL     Patient location during evaluation: Mother Baby Anesthesia Type: Epidural Level of consciousness: awake and alert Pain management: pain level controlled Vital Signs Assessment: post-procedure vital signs reviewed and stable Respiratory status: spontaneous breathing, nonlabored ventilation and respiratory function stable Cardiovascular status: stable Postop Assessment: no headache, no backache and epidural receding Anesthetic complications: no   No notable events documented.  Last Vitals:  Vitals:   09/05/22 0917 09/05/22 1228  BP: 111/65 124/70  Pulse: 69 83  Resp: 18   Temp: (!) 36.4 C 36.9 C  SpO2: 99% 99%    Last Pain:  Vitals:   09/05/22 1228  TempSrc: Oral  PainSc:    Pain Goal:                   Ann Farmer

## 2022-09-05 NOTE — Progress Notes (Signed)
Post Partum Day 0/1 Subjective: no complaints, up ad lib, voiding, and tolerating PO  Objective: Blood pressure 124/70, pulse 83, temperature 98.4 F (36.9 C), temperature source Oral, resp. rate 18, height  (1.626 m), weight 96.2 kg, SpO2 99 %, unknown if currently breastfeeding.  Physical Exam:  General: alert, cooperative, and appears stated age Lochia: appropriate Uterine Fundus: firm DVT Evaluation: No evidence of DVT seen on physical exam.  Recent Labs    09/04/22 1635 09/05/22 0534  HGB 11.2* 10.1*  HCT 32.3* 28.2*    Assessment/Plan: Routine PP care Desires neonatal circumcision, R/B/A of procedure discussed at length. Pt understands that neonatal circumcision is not considered medically necessary and is elective. The risks include, but are not limited to bleeding, infection, damage to the penis, development of scar tissue, and having to have it redone at a later date. Pt understands theses risks and wishes to proceed    LOS: 1 day   Waynard Reeds, MD 09/05/2022, 3:32 PM

## 2022-09-06 MED ORDER — IBUPROFEN 600 MG PO TABS: 600.0000 mg | ORAL_TABLET | Freq: Four times a day (QID) | ORAL | 0 refills | Status: AC

## 2022-09-06 NOTE — Lactation Note (Signed)
This note was copied from a baby's chart. Lactation Consultation Note  Patient Name: Ann Farmer [name] ZOXWR'U Date: 09/06/2022 Age:29 hours Reason for consult: Follow-up assessment;Early term 37-38.6wks;Infant weight loss;Breastfeeding assistance As LC entered the room mom  latched the baby, and gulps noted.  LC showed mom how to flip upper lip for increased depth.  Increased swallows noted and per mom more comfortable.  LC reviewed BF D/C teaching and the [facility] resources.   Maternal Data    Feeding Mother's Current Feeding Choice: Breast Milk  LATCH Score Latch: Grasps breast easily, tongue down, lips flanged, rhythmical sucking.  Audible Swallowing: Spontaneous and intermittent  Type of Nipple: Everted at rest and after stimulation  Comfort (Breast/Nipple): Filling, red/small blisters or bruises, mild/mod discomfort  Hold (Positioning): Assistance needed to correctly position infant at breast and maintain latch.  LATCH Score: 8    Interventions Interventions: Breast feeding basics reviewed;Assisted with latch;Skin to skin;Breast massage;Hand express;Breast compression;Adjust position;Support pillows;Position options;Education;LC Services brochure  Discharge Discharge Education: Engorgement and breast care;Warning signs for feeding baby Pump: Personal;DEBP;Manual  Consult Status Consult Status: Complete Date: 09/06/22    Kathrin Greathouse 09/06/2022, 9:19 AM

## 2022-09-06 NOTE — Discharge Instructions (Signed)
As per discharge pamphlet °

## 2022-09-06 NOTE — Progress Notes (Signed)
PPD #2 Doing well Afeb, VSS D/c home 

## 2022-09-06 NOTE — Discharge Summary (Signed)
Postpartum Discharge Summary      Patient Name: Ann Farmer DOB: Sep 06, 1995 MRN: [id number]  Date of admission: 09/04/2022 Delivery date:09/04/2022  Delivering provider: Derl Barrow E  Date of discharge: 09/06/2022  Admitting diagnosis: Uterine contractions [O47.9] Intrauterine pregnancy: [redacted]w[redacted]d     Secondary diagnosis:  Principal Problem:   Uterine contractions     Discharge diagnosis: Term Pregnancy Delivered                                               Hospital course: Onset of Labor With Vaginal Delivery      27 y.o. yo [id number] at [redacted]w[redacted]d was admitted in Latent Labor on 09/04/2022. Labor course was complicated by nothing  Membrane Rupture Time/Date: 9:42 PM ,09/04/2022   Delivery Method:Vaginal, Spontaneous  Episiotomy: None  Lacerations:  None  Patient had a postpartum course complicated by nothing.  She is ambulating, tolerating a regular diet, passing flatus, and urinating well. Patient is discharged home in stable condition on 09/06/22.  Newborn Data: Birth date:09/04/2022  Birth time:11:00 PM  Gender:Female  Living status:Living  Apgars:9 ,9  Weight:3550 g   Physical exam  Vitals:   09/05/22 1228 09/05/22 1627 09/05/22 2117 09/06/22 0609  BP: 124/70 125/80 130/77 120/74  Pulse: 83 82 71 70  Resp:  Temp: 98.4 F (36.9 C) 97.9 F (36.6 C) 97.8 F (36.6 C)   TempSrc: Oral Oral Oral Oral  SpO2: 99% 99% 97% 100%  Weight:      Height:       General: alert Lochia: appropriate Uterine Fundus: firm  Labs: Lab Results  Component Value Date   WBC 18.4 (H) 09/05/2022   HGB 10.1 (L) 09/05/2022   HCT 28.2 (L) 09/05/2022   MCV 77.5 (L) 09/05/2022   PLT 269 09/05/2022      Latest Ref Rng & Units 08/10/2015   10:01 PM  CMP  Glucose 65 - 99 mg/dL 89   BUN 6 - 20 mg/dL 13   Creatinine 7.25 - 1.00 mg/dL 3.66   Sodium 440 - 347 mmol/L 138   Potassium 3.5 - 5.1 mmol/L 3.7   Chloride 101 - 111 mmol/L 111   CO2 22 - 32 mmol/L 20   Calcium 8.9  - 10.3 mg/dL 8.6   Total Protein 6.5 - 8.1 g/dL 6.2   Total Bilirubin 0.3 - 1.2 mg/dL 0.5   Alkaline Phos 38 - 126 U/L 170   AST 15 - 41 U/L 21   ALT 14 - 54 U/L 12    Edinburgh Score:    09/05/2022    9:17 PM  Edinburgh Postnatal Depression Scale Screening Tool  I have been able to laugh and see the funny side of things. 0  I have looked forward with enjoyment to things. 0  I have blamed myself unnecessarily when things went wrong. 0  I have been anxious or worried for no good reason. 0  I have felt scared or panicky for no good reason. 0  Things have been getting on top of me. 1  I have been so unhappy that I have had difficulty sleeping. 0  I have felt sad or miserable. 0  I have been so unhappy that I have been crying. 0  The thought of harming myself has occurred to me. 0  Edinburgh Postnatal Depression Scale  Total 1      After visit meds:  Allergies as of 09/06/2022       Reactions   Feraheme [ferumoxytol] Swelling    right periorbital edema and sneezing         Medication List     STOP taking these medications    aspirin 81 MG chewable tablet       TAKE these medications    ibuprofen 600 MG tablet Commonly known as: ADVIL Take 1 tablet (600 mg total) by mouth every 6 (six) hours.   prenatal multivitamin Tabs tablet Take 1 tablet by mouth daily at 12 noon.         Discharge home in stable condition Infant Feeding: Breast Infant Disposition:home with mother Discharge instruction: per After Visit Summary and Postpartum booklet. Activity: Advance as tolerated. Pelvic rest for 6 weeks.  Diet: routine diet Postpartum Appointment:4 weeks Follow up Visit:  Follow-up Information     Ob/Gyn, Nestor Ramp. Schedule an appointment as soon as possible for a visit in 4 week(s).   Contact information: [address] [place] [address] [phone]                     09/06/2022 Zenaida Niece, MD

## 2023-10-15 ENCOUNTER — Encounter (HOSPITAL_BASED_OUTPATIENT_CLINIC_OR_DEPARTMENT_OTHER): Payer: Self-pay | Admitting: Emergency Medicine

## 2023-10-15 ENCOUNTER — Emergency Department (HOSPITAL_BASED_OUTPATIENT_CLINIC_OR_DEPARTMENT_OTHER)
Admission: EM | Admit: 2023-10-15 | Discharge: 2023-10-15 | Disposition: A | Discharge status: Home / Self Care | Attending: Emergency Medicine | Admitting: Emergency Medicine

## 2023-10-15 ENCOUNTER — Other Ambulatory Visit: Payer: Self-pay

## 2023-10-15 DIAGNOSIS — K529 Noninfective gastroenteritis and colitis, unspecified: Secondary | ICD-10-CM | POA: Insufficient documentation

## 2023-10-15 DIAGNOSIS — R197 Diarrhea, unspecified: Secondary | ICD-10-CM | POA: Diagnosis present

## 2023-10-15 LAB — RESP PANEL BY RT-PCR (RSV, FLU A&B, COVID)  RVPGX2
Influenza A by PCR: NEGATIVE
Influenza B by PCR: NEGATIVE
Resp Syncytial Virus by PCR: NEGATIVE
SARS Coronavirus 2 by RT PCR: NEGATIVE

## 2023-10-15 LAB — CBC WITH DIFFERENTIAL/PLATELET
Abs Immature Granulocytes: 0.01 10*3/uL (ref 0.00–0.07)
Basophils Absolute: 0 10*3/uL (ref 0.0–0.1)
Basophils Relative: 0 %
Eosinophils Absolute: 0 10*3/uL (ref 0.0–0.5)
Eosinophils Relative: 0 %
HCT: 31 % — ABNORMAL LOW (ref 36.0–46.0)
Hemoglobin: 11 g/dL — ABNORMAL LOW (ref 12.0–15.0)
Immature Granulocytes: 0 %
Lymphocytes Relative: 9 %
Lymphs Abs: 0.6 10*3/uL — ABNORMAL LOW (ref 0.7–4.0)
MCH: 27.2 pg (ref 26.0–34.0)
MCHC: 35.5 g/dL (ref 30.0–36.0)
MCV: 76.7 fL — ABNORMAL LOW (ref 80.0–100.0)
Monocytes Absolute: 0.8 10*3/uL (ref 0.1–1.0)
Monocytes Relative: 13 %
Neutro Abs: 4.7 10*3/uL (ref 1.7–7.7)
Neutrophils Relative %: 78 %
Platelets: 267 10*3/uL (ref 150–400)
RBC: 4.04 MIL/uL (ref 3.87–5.11)
RDW: 14.3 % (ref 11.5–15.5)
WBC: 6.1 10*3/uL (ref 4.0–10.5)
nRBC: 0 % (ref 0.0–0.2)

## 2023-10-15 LAB — COMPREHENSIVE METABOLIC PANEL WITH GFR
ALT: 13 U/L (ref 0–44)
AST: 15 U/L (ref 15–41)
Albumin: 4.1 g/dL (ref 3.5–5.0)
Alkaline Phosphatase: 70 U/L (ref 38–126)
Anion gap: 14 (ref 5–15)
BUN: 10 mg/dL (ref 6–20)
CO2: 21 mmol/L — ABNORMAL LOW (ref 22–32)
Calcium: 9 mg/dL (ref 8.9–10.3)
Chloride: 102 mmol/L (ref 98–111)
Creatinine, Ser: 0.76 mg/dL (ref 0.44–1.00)
GFR, Estimated: >60 mL/min (ref >60)
Glucose, Bld: 80 mg/dL (ref 70–99)
Potassium: 3.6 mmol/L (ref 3.5–5.1)
Sodium: 136 mmol/L (ref 135–145)
Total Bilirubin: 0.7 mg/dL (ref 0.0–1.2)
Total Protein: 7.2 g/dL (ref 6.5–8.1)

## 2023-10-15 LAB — MAGNESIUM: Magnesium: 1.8 mg/dL (ref 1.7–2.4)

## 2023-10-15 MED ORDER — ONDANSETRON HCL 4 MG/2ML IJ SOLN
4.0000 mg | Freq: Once | INTRAMUSCULAR | Status: AC
  Administered 2023-10-15: 4 mg via INTRAVENOUS
  Filled 2023-10-15: qty 2

## 2023-10-15 MED ORDER — LACTATED RINGERS IV BOLUS
1000.0000 mL | Freq: Once | INTRAVENOUS | Status: AC
  Administered 2023-10-15: 1000 mL via INTRAVENOUS

## 2023-10-15 MED ORDER — ACETAMINOPHEN 500 MG PO TABS
1000.0000 mg | ORAL_TABLET | Freq: Once | ORAL | Status: AC
  Administered 2023-10-15: 1000 mg via ORAL
  Filled 2023-10-15: qty 2

## 2023-10-15 MED ORDER — ONDANSETRON 4 MG PO TBDP: 4.0000 mg | ORAL_TABLET | Freq: Three times a day (TID) | ORAL | 0 refills | Status: AC | PRN

## 2023-10-15 NOTE — ED Provider Notes (Signed)
 [place] EMERGENCY DEPARTMENT AT [facility] Provider Note   CSN: [id number] Arrival date & time: 10/15/23  1308     History Chief Complaint  Patient presents with   Diarrhea    Ann Farmer is a 28 y.o. female.  Patient without significant medical history presents the emergency department today with concerns of diarrhea.  Endorses yesterday began to develop chills, body aches, nausea, vomiting and has not progressed to multiple episodes of diarrhea.  She states about 6 episodes of watery diarrhea today.  Denies any recent antibiotic use.  No sick contacts.  Denies any cough, congestion, sore throat.   Diarrhea      Home Medications Prior to Admission medications   Medication Sig Start Date End Date Taking? Authorizing Provider  ondansetron  (ZOFRAN -ODT) 4 MG disintegrating tablet Take 1 tablet (4 mg total) by mouth every 8 (eight) hours as needed for nausea or vomiting. 10/15/23  Yes Krina Mraz A, PA-C  ibuprofen  (ADVIL ) 600 MG tablet Take 1 tablet (600 mg total) by mouth every 6 (six) hours. 09/06/22   Meisinger, Ena Harries, MD  Prenatal Vit-Fe Fumarate-FA (PRENATAL MULTIVITAMIN) TABS tablet Take 1 tablet by mouth daily at 12 noon.    [provider]      Allergies    Feraheme  [ferumoxytol ]    Review of Systems   Review of Systems  Gastrointestinal:  Positive for diarrhea.  All other systems reviewed and are negative.   Physical Exam Updated Vital Signs BP (!) 101/58   Pulse (!) 103   Temp 99.3 F (37.4 C) (Oral)   Resp 18   Ht 5\' 4"  (1.626 m)   LMP 10/13/2023 (Exact Date)   SpO2 99%   BMI 36.39 kg/m  Physical Exam Vitals and nursing note reviewed.  Constitutional:      General: She is not in acute distress.    Appearance: She is well-developed.  HENT:     Head: Normocephalic and atraumatic.  Eyes:     Conjunctiva/sclera: Conjunctivae normal.  Cardiovascular:     Rate and Rhythm: Normal rate and regular rhythm.     Heart sounds: No  murmur heard. Pulmonary:     Effort: Pulmonary effort is normal. No respiratory distress.     Breath sounds: Normal breath sounds. No wheezing or rales.  Abdominal:     Palpations: Abdomen is soft.     Tenderness: There is no abdominal tenderness.  Musculoskeletal:        General: No swelling.     Cervical back: Neck supple.  Skin:    General: Skin is warm and dry.     Capillary Refill: Capillary refill takes less than 2 seconds.  Neurological:     Mental Status: She is alert.  Psychiatric:        Mood and Affect: Mood normal.     ED Results / Procedures / Treatments   Labs (all labs ordered are listed, but only abnormal results are displayed) Labs Reviewed  CBC WITH DIFFERENTIAL/PLATELET - Abnormal; Notable for the following components:      Result Value   Hemoglobin 11.0 (*)    HCT 31.0 (*)    MCV 76.7 (*)    Lymphs Abs 0.6 (*)    All other components within normal limits  COMPREHENSIVE METABOLIC PANEL WITH GFR - Abnormal; Notable for the following components:   CO2 21 (*)    All other components within normal limits  RESP PANEL BY RT-PCR (RSV, FLU A&B, COVID)  RVPGX2  MAGNESIUM  EKG None  Radiology No results found.  Procedures Procedures    Medications Ordered in ED Medications  lactated ringers  bolus 1,000 mL (0 mLs Intravenous Stopped 10/15/23 1532)  ondansetron  (ZOFRAN ) injection 4 mg (4 mg Intravenous Given 10/15/23 1420)  acetaminophen  (TYLENOL ) tablet 1,000 mg (1,000 mg Oral Given 10/15/23 1453)    ED Course/ Medical Decision Making/ A&P                                 Medical Decision Making Amount and/or Complexity of Data Reviewed Labs: ordered.  Risk OTC drugs. Prescription drug management.   This patient presents to the ED for concern of diarrhea.  Differential diagnosis includes gastroenteritis, diverticulitis, bowel obstruction, sepsis   Lab Tests:  I Ordered, and personally interpreted labs.  The pertinent results include:  Respiratory panel negative, CBC unremarkable, CMP without obvious signs of dehydration, magnesium normal, respiratory panel negative for COVID, influenza, RSV   Medicines ordered and prescription drug management:  I ordered medication including fluids, Zofran , Tylenol  for dehydration, nausea, pain Reevaluation of the patient after these medicines showed that the patient improved I have reviewed the patients home medicines and have made adjustments as needed   Problem List / ED Course:  Patient without significant medical history presents the emergency department today with concerns of diarrhea and vomiting.  Reports symptoms began yesterday and persisted through today.  Has had chills and bodyaches denies any obvious fevers.  No obvious sick contacts.  Denies any obvious concerns for fluid related illness. On exam, patient is uncomfortable but otherwise normal-appearing with only borderline tachycardia likely secondary to dehydration.  Patient afebrile.  Will proceed with basic lab workup primarily for assessment of hydration status.  Fluid resuscitation initiated with 1 L of LR.  Added on Zofran  and Tylenol  for nausea and pain.  With no focal abdominal tenderness, hold off on imaging of the abdomen.  Suspect gastroenteritis. Lab workup is reassuring.  No significant electrolyte derangements or evidence of AKI.  Patient reports improvement after fluids, Zofran , and Tylenol .  Will discharge patient home with a prescription for Zofran  for continued management of the nausea.  Return precautions discussed.  Otherwise stable for outpatient follow up and discharge home.  Final Clinical Impression(s) / ED Diagnoses Final diagnoses:  Gastroenteritis    Rx / DC Orders ED Discharge Orders          Ordered    ondansetron  (ZOFRAN -ODT) 4 MG disintegrating tablet  Every 8 hours PRN        10/15/23 1636              Doron Shake A, PA-C 10/16/23 0001    Lind Repine, MD 10/18/23 [phone]

## 2023-10-15 NOTE — Discharge Instructions (Signed)
 You were seen in the emergency department today for concerns of diarrhea and vomiting.  Your labs were thankfully reassuring and no obvious signs of severe dehydration.  I suspect you likely have gastroenteritis.  Zofran  has been sent to your pharmacy for nausea control.  You may take Imodium which is available over-the-counter for diarrhea if the symptoms persist.  For any concerns or worsening symptoms, return to the emergency department for assessment.

## 2023-10-15 NOTE — ED Triage Notes (Signed)
 Pt c/o chills, nausea, diarrhea, headache, bodyaches since last night.

## 2024-03-17 ENCOUNTER — Emergency Department
Admission: EM | Admit: 2024-03-17 | Discharge: 2024-03-17 | Disposition: A | Discharge status: Home / Self Care | Attending: Emergency Medicine | Admitting: Emergency Medicine

## 2024-03-17 ENCOUNTER — Emergency Department

## 2024-03-17 ENCOUNTER — Ambulatory Visit
Admission: EM | Admit: 2024-03-17 | Discharge: 2024-03-17 | Disposition: A | Discharge status: Home / Self Care | Attending: Emergency Medicine | Admitting: Emergency Medicine

## 2024-03-17 DIAGNOSIS — R079 Chest pain, unspecified: Secondary | ICD-10-CM | POA: Insufficient documentation

## 2024-03-17 LAB — CBC
HCT: 33.1 % — ABNORMAL LOW (ref 36.0–46.0)
Hemoglobin: 11.5 g/dL — ABNORMAL LOW (ref 12.0–15.0)
MCH: 26.7 pg (ref 26.0–34.0)
MCHC: 34.7 g/dL (ref 30.0–36.0)
MCV: 77 fL — ABNORMAL LOW (ref 80.0–100.0)
Platelets: 371 K/uL (ref 150–400)
RBC: 4.3 MIL/uL (ref 3.87–5.11)
RDW: 14 % (ref 11.5–15.5)
WBC: 7.6 K/uL (ref 4.0–10.5)
nRBC: 0 % (ref 0.0–0.2)

## 2024-03-17 LAB — BASIC METABOLIC PANEL WITH GFR
Anion gap: 10 (ref 5–15)
BUN: 14 mg/dL (ref 6–20)
CO2: 24 mmol/L (ref 22–32)
Calcium: 8.9 mg/dL (ref 8.9–10.3)
Chloride: 105 mmol/L (ref 98–111)
Creatinine, Ser: 0.33 mg/dL — ABNORMAL LOW (ref 0.44–1.00)
GFR, Estimated: >60 mL/min (ref >60)
Glucose, Bld: 83 mg/dL (ref 70–99)
Potassium: 3.5 mmol/L (ref 3.5–5.1)
Sodium: 139 mmol/L (ref 135–145)

## 2024-03-17 LAB — D-DIMER, QUANTITATIVE: D-Dimer, Quant: 0.35 ug{FEU}/mL (ref 0.00–0.50)

## 2024-03-17 LAB — TROPONIN I (HIGH SENSITIVITY)
Troponin I (High Sensitivity): <2 ng/L (ref <18)
Troponin I (High Sensitivity): <2 ng/L (ref <18)

## 2024-03-17 MED ORDER — FAMOTIDINE 20 MG PO TABS: 20.0000 mg | ORAL_TABLET | Freq: Two times a day (BID) | ORAL | 0 refills | Status: AC

## 2024-03-17 MED ORDER — PANTOPRAZOLE SODIUM 40 MG IV SOLR
40.0000 mg | Freq: Once | INTRAVENOUS | Status: AC
  Administered 2024-03-17: 40 mg via INTRAVENOUS
  Filled 2024-03-17: qty 10

## 2024-03-17 MED ORDER — SODIUM CHLORIDE 0.9 % IV BOLUS
500.0000 mL | Freq: Once | INTRAVENOUS | Status: AC
  Administered 2024-03-17: 500 mL via INTRAVENOUS

## 2024-03-17 NOTE — ED Provider Notes (Signed)
 [facility] Provider Note    Event Date/Time   First MD Initiated Contact with Patient 03/17/24 1100     (approximate)   History   Chief Complaint: Chest Pain   HPI  Ann Farmer is a 28 y.o. female with no significant past medical history who comes ED complaining of intermittent left chest pain for the past 2 or 3 weeks.  Feels tight, achy, no specific aggravating or alleviating factors.  Last for about 10 minutes at a time.  Associated with shortness of breath.  No vomiting or diaphoresis or radiation.  Not exertional, nonpleuritic.  No recent travel trauma hospitalization or surgery, no history of DVT or PE.  She is on oral contraceptives.        Past Medical History:  Diagnosis Date   Anemia    Chlamydia    Hemoglobin C trait    Pregnancy induced hypertension    previous preg    Current Outpatient Rx   Order #: [id number] Class: Normal   Order #: [id number] Class: Normal   Order #: [id number] Class: Normal   Order #: [id number] Class: Historical Med    Past Surgical History:  Procedure Laterality Date   FRACTURE SURGERY     at 4 years   HERNIA REPAIR     as an infant    Physical Exam   Triage Vital Signs: ED Triage Vitals  Encounter Vitals Group     BP 03/17/24 1101 110/69     Girls Systolic BP Percentile --      Girls Diastolic BP Percentile --      Boys Systolic BP Percentile --      Boys Diastolic BP Percentile --      Pulse Rate 03/17/24 1101 69     Resp 03/17/24 1101 18     Temp 03/17/24 1101 (!) 97.5 F (36.4 C)     Temp Source 03/17/24 1101 Oral     SpO2 03/17/24 1101 100 %     Weight 03/17/24 1054 180 lb (81.6 kg)     Height 03/17/24 1054 5' 4 (1.626 m)     Head Circumference --      Peak Flow --      Pain Score 03/17/24 1054 7     Pain Loc --      Pain Education --      Exclude from Growth Chart --     Most recent vital signs: Vitals:   03/17/24 1300 03/17/24 1330  BP: 131/77 123/74  Pulse:    Resp: 15  15  Temp:    SpO2: 100% 100%    General: Awake, no distress.  CV:  Good peripheral perfusion.  Regular rate rhythm, symmetric distal pulses Resp:  Normal effort.  Clear lungs Abd:  No distention.  Soft, nontender Other:  No lower extremity edema or calf tenderness.  Symmetric calf circumference.   ED Results / Procedures / Treatments   Labs (all labs ordered are listed, but only abnormal results are displayed) Labs Reviewed  BASIC METABOLIC PANEL WITH GFR - Abnormal; Notable for the following components:      Result Value   Creatinine, Ser 0.33 (*)    All other components within normal limits  CBC - Abnormal; Notable for the following components:   Hemoglobin 11.5 (*)    HCT 33.1 (*)    MCV 77.0 (*)    All other components within normal limits  D-DIMER, QUANTITATIVE  POC URINE PREG, ED  TROPONIN I (HIGH SENSITIVITY)  TROPONIN I (HIGH SENSITIVITY)     EKG Interpreted by me Sinus rhythm rate of 82.  Normal axis and intervals.  Normal QRS ST segments and T waves.  No ischemic changes.   RADIOLOGY Chest x-ray interpreted by me, appears normal.  Radiology report reviewed   PROCEDURES:  Procedures   MEDICATIONS ORDERED IN ED: Medications  sodium chloride  0.9 % bolus 500 mL (0 mLs Intravenous Stopped 03/17/24 1223)  pantoprazole (PROTONIX) injection 40 mg (40 mg Intravenous Given 03/17/24 1142)     IMPRESSION / MDM / ASSESSMENT AND PLAN / ED COURSE  I reviewed the triage vital signs and the nursing notes.  DDx: Non-STEMI, pulmonary embolism, pneumonia, GERD, stress/anxiety  Patient's presentation is most consistent with acute presentation with potential threat to life or bodily function.  Patient presents with nonspecific chest pain, intermittent over the past 2 or 3 weeks.  Vitals are normal.  Will check labs, give IV Protonix.   ----------------------------------------- 2:15 PM on 03/17/2024 ----------------------------------------- Feeling better.  Workup  normal.  Suspect stress reaction and GERD.  Stable for discharge      FINAL CLINICAL IMPRESSION(S) / ED DIAGNOSES   Final diagnoses:  Nonspecific chest pain     Rx / DC Orders   ED Discharge Orders          Ordered    Ambulatory Referral to Primary Care (Establish Care)        03/17/24 1413    famotidine (PEPCID) 20 MG tablet  2 times daily        03/17/24 1414             Note:  This document was prepared using Dragon voice recognition software and may include unintentional dictation errors.   Viviann Pastor, MD 03/17/24 1415

## 2024-03-17 NOTE — ED Triage Notes (Signed)
 Pt presents with intermittent CP x 2-3 weeks.  Feels like a tightness. Starts at random times with no known aggravating factors. Will last a few hours then go away with no intervention. Has had intermittent HA as well but not associated with CP episodes. Denies radiation, nausea, LOC etc. States last night when she was playing cards it hit and she could lift L arm. Provider to bedside for eval.

## 2024-03-17 NOTE — ED Triage Notes (Signed)
 Pt presents to the ED via POV from UC. Pt reports intermittent CP x2-3 weeks. Pt reports this pain to be on her left side and tight and achy in nature.

## 2024-03-17 NOTE — ED Notes (Signed)
 Patient is being discharged from the Urgent Care and sent to the Emergency Department via POV . Per A White NP, patient is in need of higher level of care due to unable to r/o cardiac event. Patient is aware and verbalizes understanding of plan of care.  Vitals:   03/17/24 1020  BP: 109/74  Pulse: 93  Resp: (!) 22  Temp: 98.1 F (36.7 C)

## 2024-03-17 NOTE — ED Provider Notes (Signed)
 Patient presents for evaluation of left-sided intermittent chest pain present for 2 to 3 weeks, symptoms worsening 1 day ago with pain becoming more prominent and begun to experience a heaviness and pain to the left arm making it difficult to lift.  Accompanying shortness of breath and headaches with chest pain.  Headache present today described as throbbing with dizziness.  Denies visual changes.  No personal cardiac history.  Within the last few weeks sister and father have had cardiac events with sister requiring stent placement.  Denies change in daily medications only taking multivitamin.  Accompanied by husband.  Vitals are stable, EKG showing normal sinus rhythm, will require further evaluation and cardiac workup therefore transported to emergency department by personal vehicle   Teresa Shelba SAUNDERS, NP 03/17/24 1039

## 2024-06-19 ENCOUNTER — Ambulatory Visit: Admitting: Family Medicine

## 2024-06-19 NOTE — Progress Notes (Unsigned)
 "  New Patient Office Visit  Patient ID: Ann Farmer, Female   DOB: 03-Sep-1995 28 y.o. MRN: [id number]  No chief complaint on file.  Subjective:     Ann Farmer presents to establish care  HPI  Discussed the use of AI scribe software for clinical note transcription with the patient, who gave verbal consent to proceed.  History of Present Illness    Outpatient Encounter Medications as of 06/19/2024  Medication Sig   famotidine  (PEPCID ) 20 MG tablet Take 1 tablet (20 mg total) by mouth 2 (two) times daily.   ibuprofen  (ADVIL ) 600 MG tablet Take 1 tablet (600 mg total) by mouth every 6 (six) hours.   ondansetron  (ZOFRAN -ODT) 4 MG disintegrating tablet Take 1 tablet (4 mg total) by mouth every 8 (eight) hours as needed for nausea or vomiting.   Prenatal Vit-Fe Fumarate-FA (PRENATAL MULTIVITAMIN) TABS tablet Take 1 tablet by mouth daily at 12 noon.   No facility-administered encounter medications on file as of 06/19/2024.    Past Medical History:  Diagnosis Date   Anemia    Chlamydia    Hemoglobin C trait    Pregnancy induced hypertension    previous preg    Past Surgical History:  Procedure Laterality Date   FRACTURE SURGERY     at 4 years   HERNIA REPAIR     as an infant    Family History  Problem Relation Age of Onset   Asthma Other    Alcohol abuse Neg Hx     Social History   Socioeconomic History   Marital status: Married    Spouse name: Optician, Dispensing   Number of children: 3   Years of education: Not on file   Highest education level: Not on file  Occupational History   Not on file  Tobacco Use   Smoking status: Never   Smokeless tobacco: Never  Vaping Use   Vaping status: Never Used  Substance and Sexual Activity   Alcohol use: Not Currently   Drug use: No   Sexual activity: Yes    Birth control/protection: None  Other Topics Concern   Not on file  Social History Narrative   ** Merged History Encounter **       Social Drivers of  Health   Tobacco Use: Low Risk (03/17/2024)   Patient History    Smoking Tobacco Use: Never    Smokeless Tobacco Use: Never    Passive Exposure: Not on file  Financial Resource Strain: Not on file  Food Insecurity: No Food Insecurity (09/04/2022)   Hunger Vital Sign    Worried About Running Out of Food in the Last Year: Never true    Ran Out of Food in the Last Year: Never true  Transportation Needs: No Transportation Needs (09/04/2022)   PRAPARE - Administrator, Civil Service (Medical): No    Lack of Transportation (Non-Medical): No  Physical Activity: Not on file  Stress: Not on file  Social Connections: Not on file  Intimate Partner Violence: Not At Risk (09/04/2022)   Humiliation, Afraid, Rape, and Kick questionnaire    Fear of Current or Ex-Partner: No    Emotionally Abused: No    Physically Abused: No    Sexually Abused: No  Depression (PHQ2-9): Not on file  Alcohol Screen: Not on file  Housing: Low Risk (09/04/2022)   Housing    Last Housing Risk Score: 0  Utilities: Not At Risk (09/04/2022)   [facility] Utilities  Threatened with loss of utilities: No  Health Literacy: Not on file    ROS    Objective:    There were no vitals taken for this visit.  Physical Exam {PhysExam Abridge (Optional):[id number]}  Last CBC Lab Results  Component Value Date   WBC 7.6 03/17/2024   HGB 11.5 (L) 03/17/2024   HCT 33.1 (L) 03/17/2024   MCV 77.0 (L) 03/17/2024   MCH 26.7 03/17/2024   RDW 14.0 03/17/2024   PLT 371 03/17/2024   Last metabolic panel Lab Results  Component Value Date   GLUCOSE 83 03/17/2024   NA 139 03/17/2024   K 3.5 03/17/2024   CL 105 03/17/2024   CO2 24 03/17/2024   BUN 14 03/17/2024   CREATININE 0.33 (L) 03/17/2024   GFRNONAA >60 03/17/2024   CALCIUM 8.9 03/17/2024   PROT 7.2 10/15/2023   ALBUMIN 4.1 10/15/2023   BILITOT 0.7 10/15/2023   ALKPHOS 70 10/15/2023   AST 15 10/15/2023   ALT 13 10/15/2023   ANIONGAP 10 03/17/2024   Last  lipids No results found for: CHOL, HDL, LDLCALC, LDLDIRECT, TRIG, CHOLHDL Last hemoglobin A1c No results found for: HGBA1C Last thyroid functions No results found for: TSH, T3TOTAL, T4TOTAL, FREET4, THYROIDAB Last vitamin D  Lab Results  Component Value Date   VD25OH 10.8 (L) 07/06/2016   Last vitamin B12 and Folate No results found for: VITAMINB12, FOLATE      Assessment & Plan:   Problem List Items Addressed This Visit   None   Assessment and Plan Assessment & Plan     No follow-ups on file.   Rockie Agent, MD [facility]     "
